# Patient Record
Sex: Male | Born: 1955 | Race: White | Hispanic: No | Marital: Single | Smoking: Current every day smoker
Health system: Southern US, Community
[De-identification: ages and names within clinical notes are randomized; demographics above are authoritative.]

## PROBLEM LIST (undated history)

## (undated) DIAGNOSIS — M1990 Unspecified osteoarthritis, unspecified site: Secondary | ICD-10-CM

## (undated) DIAGNOSIS — I499 Cardiac arrhythmia, unspecified: Secondary | ICD-10-CM

## (undated) DIAGNOSIS — K219 Gastro-esophageal reflux disease without esophagitis: Secondary | ICD-10-CM

## (undated) DIAGNOSIS — R0600 Dyspnea, unspecified: Secondary | ICD-10-CM

## (undated) DIAGNOSIS — I10 Essential (primary) hypertension: Secondary | ICD-10-CM

## (undated) DIAGNOSIS — E785 Hyperlipidemia, unspecified: Secondary | ICD-10-CM

## (undated) DIAGNOSIS — I2510 Atherosclerotic heart disease of native coronary artery without angina pectoris: Secondary | ICD-10-CM

## (undated) DIAGNOSIS — I219 Acute myocardial infarction, unspecified: Secondary | ICD-10-CM

## (undated) HISTORY — DX: Atherosclerotic heart disease of native coronary artery without angina pectoris: I25.10

## (undated) HISTORY — DX: Hyperlipidemia, unspecified: E78.5

## (undated) HISTORY — PX: TONSILLECTOMY: SUR1361

---

## 2016-04-16 ENCOUNTER — Other Ambulatory Visit (HOSPITAL_COMMUNITY): Payer: Self-pay | Admitting: Family

## 2016-04-16 ENCOUNTER — Ambulatory Visit (HOSPITAL_COMMUNITY)
Admission: RE | Admit: 2016-04-16 | Discharge: 2016-04-16 | Disposition: A | Discharge status: Home / Self Care | Payer: Self-pay | Source: Ambulatory Visit | Attending: Family | Admitting: Family

## 2016-04-16 DIAGNOSIS — M25551 Pain in right hip: Secondary | ICD-10-CM

## 2016-04-16 DIAGNOSIS — M1611 Unilateral primary osteoarthritis, right hip: Secondary | ICD-10-CM | POA: Insufficient documentation

## 2016-05-04 ENCOUNTER — Telehealth: Payer: Self-pay | Admitting: Orthopedic Surgery

## 2016-05-04 NOTE — Telephone Encounter (Signed)
Called patient for appointment for problem of hip pain, per email from financial counselor, Preston ConnorsAshley Ortiz, as she is working on completing his financial assistance paperwork. Left voice message for patient at his [phone]#[phone]

## 2016-05-06 NOTE — Telephone Encounter (Signed)
05/04/16 Spoke with patient; appointment scheduled; patient aware.

## 2016-05-21 ENCOUNTER — Ambulatory Visit: Payer: Self-pay | Admitting: Orthopedic Surgery

## 2016-05-28 ENCOUNTER — Ambulatory Visit: Payer: Self-pay | Admitting: Orthopedic Surgery

## 2016-09-02 ENCOUNTER — Telehealth: Payer: Self-pay | Admitting: Orthopedic Surgery

## 2016-09-02 NOTE — Telephone Encounter (Signed)
Patient's wife called this morning stating that she wanted to make an appointment for her husband. He was scheduled to see Dr. Romeo AppleHarrison in July but canceled that appointment and was scheduled again in August but was a no show for that appointment.  I told her that I do not have a letter from [facility] stating that he has the [facility] discount and that I would need this per our policy before I could schedule her husband.  She said she understood and she will bring this by the office.

## 2016-09-14 ENCOUNTER — Ambulatory Visit (INDEPENDENT_AMBULATORY_CARE_PROVIDER_SITE_OTHER): Payer: Self-pay

## 2016-09-14 ENCOUNTER — Ambulatory Visit (INDEPENDENT_AMBULATORY_CARE_PROVIDER_SITE_OTHER): Payer: Self-pay | Admitting: Orthopedic Surgery

## 2016-09-14 ENCOUNTER — Encounter: Payer: Self-pay | Admitting: Orthopedic Surgery

## 2016-09-14 VITALS — BP 155/102 | HR 90 | Ht 70.0 in | Wt 274.0 lb

## 2016-09-14 DIAGNOSIS — M1611 Unilateral primary osteoarthritis, right hip: Secondary | ICD-10-CM

## 2016-09-14 DIAGNOSIS — G8929 Other chronic pain: Secondary | ICD-10-CM

## 2016-09-14 DIAGNOSIS — M5441 Lumbago with sciatica, right side: Secondary | ICD-10-CM

## 2016-09-14 NOTE — Progress Notes (Signed)
Patient ID: Preston Ortiz Nickless, male   DOB: 01-Jan-1956, 60 y.o.   MRN: [id number]  Chief Complaint  Patient presents with  . Hip Pain    RIGHT HIP PAIN    HPI Preston Ortiz Passero is a 60 y.o. male.  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 Patient complains of pain for 15 years with no history of direct hip trauma. He has a history of lumbar spine pain since 1987 never had surgery.  He complains of pain over his right greater trochanter right buttock radiates to his right ankle and then he complains of pain in his right groin loss of motion in his right hip difficulty with activities of daily living  His prior treatment includes diclofenac ibuprofen and extra strength Tylenol  He is a smoker. He is allergic to penicillin.  [facility] [place]  Current medications are amlodipine and diclofenac he declined and stop taking gabapentin because he said it made him bleed  History of GERD hypertension arthritis  Family history cancer kidney disease and hypertension  History of tonsillectomy 1965  Review of Systems Review of Systems Denies shortness of breath  Denies chest pain    No past medical history on file.  No past surgical history on file.  Social History Social History  Substance Use Topics  . Smoking status: Not on file  . Smokeless tobacco: Not on file  . Alcohol use Not on file    Allergies not on file  No outpatient prescriptions have been marked as taking for the 09/14/16 encounter (Office Visit) with Vickki HearingStanley E Harrison, MD.      Physical Exam Physical Exam BP (!) 155/102   Pulse 90   Ht 5\' 10"  (1.778 m)   Wt 274 lb (124.3 kg)   BMI 39.31 kg/m   Gen. appearance. The patient is well-developed and well-nourished, grooming and hygiene are normal. There are no gross congenital abnormalities  The patient is alert and oriented to person place and time  Mood and affect are normal  Ambulation antalgic   Examination  reveals the following: On inspection we find tenderness in the right lower lumbar region nontender on the left no tenderness in the groin  With the range of motion of  right hip flexion 90 left hip flexion 110 both painful he has 0 internal rotation on the right and 10 on the left  Stability tests were normal  in each hip  Strength tests revealed grade 5 motor strength in each leg  Skin we find no rash ulceration or erythema right or left leg  Sensation remains intact right and left leg  Impression vascular system shows no peripheral edema right and left ankle  Data Reviewed There is an x-ray of the pelvis and hip which show severe arthritis and irregularity of the femoral head on the right osteoarthritis moderate on the left. The right hips looks severely diseased  Lumbar spine films taken in the office  Assessment    Encounter Diagnoses  Name Primary?  Marland Kitchen. Arthritis of right hip Yes  . Chronic right-sided low back pain with right-sided sciatica        Plan    Referral for right total hip arthroplasty  Encouraged to stop smoking       Fuller CanadaStanley Harrison 09/14/2016, 1:54 PM

## 2016-09-14 NOTE — Patient Instructions (Addendum)
Total Hip Replacement Total hip replacement is a surgery to replace your damaged hip joint. Your hip joint is replaced with a man-made (artificial) hip joint. This man-made hip joint is called a prosthesis. This surgery is done to lessen pain and improve movement. What happens before the procedure?  Do not eat or drink anything after midnight on the night before the procedure or as told by your doctor.  Ask your doctor about:  Changing or stopping your normal medicines. This is important if you take diabetes medicines or blood thinners.  Taking aspirin or ibuprofen medicines. These thin your blood. Do not take these medicines if your doctor tells you not to.  Plan to have someone take you home after the procedure.  Ask your health care team how your surgery site will be marked.  You may be given medicines that kill germs (antibiotics) to help prevent infection. What happens during the procedure?  To help prevent infection:  Your health care team will wash or sanitize their hands.  Your skin will be washed with soap.  An IV tube will be put into one of your veins.  You will be given one or more of the following:  A medicine that makes you relaxed (sedative).  A medicine that makes you fall asleep (general anesthetic).  A medicine that numbs your body below the waist (spinal anesthetic).  A cut (incision) will be made in your hip. Your surgeon will take out any damaged parts of your hip joint.  Your surgeon will then:  Put a man-made hip joint into your pelvic bone. Screws may be used to keep the hip joint in place.  Take out the damaged ball of your thigh bone (femur). A man-made ball on a metal pole will replace the damaged ball.  The ball will be put into the new socket to make a new hip joint. Your hip joint will be checked to see if it moves as it should.  Close the cut and place a bandage over it. What happens after the procedure?  You will stay in a recovery area  until your medicines wear off.  Your nurse will monitor your vital signs. These include:  Your pulse.  Your blood pressure.  Once you are doing okay, you will be taken to your hospital room.  You may be told to take actions to help prevent blood clots. These may include:  Walking soon after surgery with someone helping you. Moving around helps to improve blood flow.  Taking medicines to thin your blood (anticoagulants).  Wearing special socks (compression stockings) or using other types of devices.  You will do exercise therapy (physical therapy) until you are doing well. Your doctor will tell you when you are well enough to go home. This information is not intended to replace advice given to you by your health care provider. Make sure you discuss any questions you have with your health care provider. Document Released: 01/04/2012 Document Revised: 06/15/2016 Document Reviewed: 12/13/2013 Elsevier Interactive Patient Education  2017 [employer]

## 2016-10-14 ENCOUNTER — Encounter (INDEPENDENT_AMBULATORY_CARE_PROVIDER_SITE_OTHER): Payer: Self-pay | Admitting: Orthopaedic Surgery

## 2016-10-14 ENCOUNTER — Ambulatory Visit (INDEPENDENT_AMBULATORY_CARE_PROVIDER_SITE_OTHER): Payer: Self-pay | Admitting: Orthopaedic Surgery

## 2016-10-14 VITALS — Ht 70.0 in | Wt 274.0 lb

## 2016-10-14 DIAGNOSIS — M25551 Pain in right hip: Secondary | ICD-10-CM

## 2016-10-14 DIAGNOSIS — M1611 Unilateral primary osteoarthritis, right hip: Secondary | ICD-10-CM

## 2016-10-14 NOTE — Progress Notes (Signed)
Office Visit Note   Patient: Preston PopperDouglas Grassia           Date of Birth: 29-Jul-1956           MRN: [id number] Visit Date: 10/14/2016              Requested by: Jerrell BelfastKaren A House, FNP [address] [place], [place] [id number] PCP: Jerrell BelfastHouse, Karen A, FNP   Assessment & Plan: Visit Diagnoses:  1. Pain of right hip joint   2. Unilateral primary osteoarthritis, right hip     Plan: At this point his arthritis is very severe. It is detrimentally affected his activities daily living, his quality of life, and his mobility. He is tried and failed all forms conservative treatment and there is no way we can even try a steroid injection intra-articularly in that right hip because is no joint space at all. At this point we are recommending a total hip replacement through direct anterior approach. I showed him a hip model and his x-rays as explained in detail what the surgery involves as well as a thorough discussion of the risk and benefits of the surgery. He does wish proceed with this in the near future. We will work on getting this scheduled and then we would see him back 2 weeks postoperative but no x-rays of be needed.  Follow-Up Instructions: Return for 2 weeks post-op.   Orders:  No orders of the defined types were placed in this encounter.  No orders of the defined types were placed in this encounter.     Procedures: No procedures performed   Clinical Data: No additional findings.   Subjective: Chief Complaint  Patient presents with  . Right Hip - Pain    HPI He is a very pleasant 60 year old gentleman 15 years plus of right hip pain. He is follow-up for many ladders over the years. He says now his right leg is much shorter his left leg. His pain is 10 out of 10. It is daily pain. It wakes him up from sleep. His left hip feels normal to him. He said his right hip is very stiff and outs affected his back significantly. He has had x-rays that accompany him of his right hip and he has he has  severe osteoarthritis of the right hip. He works as a Lobbyisttraveling electrician. Review of Systems He currently denies any headache, shortness of breath, chest chest pain, fever, chills, nausea, vomiting.  Objective: Vital Signs: Ht 5\' 10"  (1.778 m)   Wt 274 lb (124.3 kg)   BMI 39.31 kg/m   Physical Exam He is a very pleasant individual. He is alert and oriented 3 in no acute distress. Ortho Exam He ambulates with a significant limp. His right leg is deathly shorter than his left leg. He has essentially almost no range of motion of that right hip due to severe stiffness and pain in the groin. Specialty Comments:  No specialty comments available.  Imaging: No results found. X-rays on the cone system of his pelvis and right hip show severe end-stage arthritis. There is complete loss of the hip joint. There is superior migration of the femoral head. There severe cystic changes and sclerotic changes. There is abundant para-articular osteophytes as well.  PMFS History: There are no active problems to display for this patient.  No past medical history on file.  No family history on file.  No past surgical history on file. Social History   Occupational History  . Not on file.  Social History Main Topics  . Smoking status: Current Every Day Smoker  . Smokeless tobacco: Not on file  . Alcohol use Not on file  . Drug use: Unknown  . Sexual activity: Not on file

## 2016-10-23 ENCOUNTER — Other Ambulatory Visit (INDEPENDENT_AMBULATORY_CARE_PROVIDER_SITE_OTHER): Payer: Self-pay | Admitting: Physician Assistant

## 2016-10-23 NOTE — Progress Notes (Signed)
Scheduling pre op-  Please PLACE SURGICAL ORDERS IN EPIC  THANKS 

## 2016-10-30 ENCOUNTER — Other Ambulatory Visit (HOSPITAL_COMMUNITY): Payer: Self-pay | Admitting: *Deleted

## 2016-10-30 NOTE — Patient Instructions (Addendum)
Preston Ortiz  10/30/2016   Your procedure is scheduled on: 11-06-16  [facility]  [facility] to 3rd floor to  Short Stay Center at 740 AM.  Call this number if you have problems the morning of surgery [phone]   Remember: ONLY 1 PERSON MAY GO WITH YOU TO SHORT STAY TO GET  READY MORNING OF YOUR SURGERY.  Do not eat food or drink liquids :After Midnight.     Take these medicines the morning of surgery with A SIP OF WATER: AMLODIPINE (NORVASC), TYLENOL as needed                               You may not have any metal on your body including hair pins and              piercings  Do not wear jewelry, make-up, lotions, powders or perfumes, deodorant             Do not wear nail polish.  Do not shave  48 hours prior to surgery.              Men may shave face and neck.   Do not bring valuables to the hospital. [facility] IS NOT             RESPONSIBLE   FOR VALUABLES.  Contacts, dentures or bridgework may not be worn into surgery.  Leave suitcase in the car. After surgery it may be brought to your room.                 Please read over the following fact sheets you were given: _____________________________________________________________________             [facility] - Preparing for Surgery Before surgery, you can play an important role.  Because skin is not sterile, your skin needs to be as free of germs as possible.  You can reduce the number of germs on your skin by washing with CHG (chlorahexidine gluconate) soap before surgery.  CHG is an antiseptic cleaner which kills germs and bonds with the skin to continue killing germs even after washing. Please DO NOT use if you have an allergy to CHG or antibacterial soaps.  If your skin becomes reddened/irritated stop using the CHG and inform your nurse when you arrive at Short Stay. Do not shave (including legs and underarms) for at least 48 hours prior to the first CHG  shower.  You may shave your face/neck. Please follow these instructions carefully:  1.  Shower with CHG Soap the night before surgery and the  morning of Surgery.  2.  If you choose to wash your hair, wash your hair first as usual with your  normal  shampoo.  3.  After you shampoo, rinse your hair and body thoroughly to remove the  shampoo.                           4.  Use CHG as you would any other liquid soap.  You can apply chg directly  to the skin and wash                       Gently with a scrungie or clean washcloth.  5.  Apply the  CHG Soap to your body ONLY FROM THE NECK DOWN.   Do not use on face/ open                           Wound or open sores. Avoid contact with eyes, ears mouth and genitals (private parts).                       Wash face,  Genitals (private parts) with your normal soap.             6.  Wash thoroughly, paying special attention to the area where your surgery  will be performed.  7.  Thoroughly rinse your body with warm water from the neck down.  8.  DO NOT shower/wash with your normal soap after using and rinsing off  the CHG Soap.                9.  Pat yourself dry with a clean towel.            10.  Wear clean pajamas.            11.  Place clean sheets on your bed the night of your first shower and do not  sleep with pets. Day of Surgery : Do not apply any lotions/deodorants the morning of surgery.  Please wear clean clothes to the hospital/surgery center.  FAILURE TO FOLLOW THESE INSTRUCTIONS MAY RESULT IN THE CANCELLATION OF YOUR SURGERY PATIENT SIGNATURE_________________________________  NURSE SIGNATURE__________________________________  ________________________________________________________________________   [name]  An incentive spirometer is a tool that can help keep your lungs clear and active. This tool measures how well you are filling your lungs with each breath. Taking long deep breaths may help reverse or decrease the chance  of developing breathing (pulmonary) problems (especially infection) following:  A long period of time when you are unable to move or be active. BEFORE THE PROCEDURE   If the spirometer includes an indicator to show your best effort, your nurse or respiratory therapist will set it to a desired goal.  If possible, sit up straight or lean slightly forward. Try not to slouch.  Hold the incentive spirometer in an upright position. INSTRUCTIONS FOR USE  1. Sit on the edge of your bed if possible, or sit up as far as you can in bed or on a chair. 2. Hold the incentive spirometer in an upright position. 3. Breathe out normally. 4. Place the mouthpiece in your mouth and seal your lips tightly around it. 5. Breathe in slowly and as deeply as possible, raising the piston or the ball toward the top of the column. 6. Hold your breath for 3-5 seconds or for as long as possible. Allow the piston or ball to fall to the bottom of the column. 7. Remove the mouthpiece from your mouth and breathe out normally. 8. Rest for a few seconds and repeat Steps 1 through 7 at least 10 times every 1-2 hours when you are awake. Take your time and take a few normal breaths between deep breaths. 9. The spirometer may include an indicator to show your best effort. Use the indicator as a goal to work toward during each repetition. 10. After each set of 10 deep breaths, practice coughing to be sure your lungs are clear. If you have an incision (the cut made at the time of surgery), support your incision when coughing by placing a pillow or rolled up  towels firmly against it. Once you are able to get out of bed, walk around indoors and cough well. You may stop using the incentive spirometer when instructed by your caregiver.  RISKS AND COMPLICATIONS  Take your time so you do not get dizzy or light-headed.  If you are in pain, you may need to take or ask for pain medication before doing incentive spirometry. It is harder to  take a deep breath if you are having pain. AFTER USE  Rest and breathe slowly and easily.  It can be helpful to keep track of a log of your progress. Your caregiver can provide you with a simple table to help with this. If you are using the spirometer at home, follow these instructions: [place] IF:   You are having difficultly using the spirometer.  You have trouble using the spirometer as often as instructed.  Your pain medication is not giving enough relief while using the spirometer.  You develop fever of 100.5 F (38.1 C) or higher. SEEK IMMEDIATE MEDICAL CARE IF:   You cough up bloody sputum that had not been present before.  You develop fever of 102 F (38.9 C) or greater.  You develop worsening pain at or near the incision site. MAKE SURE YOU:   Understand these instructions.  Will watch your condition.  Will get help right away if you are not doing well or get worse. Document Released: 02/22/2007 Document Revised: 01/04/2012 Document Reviewed: 04/25/2007 ExitCare Patient Information 2014 ExitCare, [place].   ________________________________________________________________________  WHAT IS A BLOOD TRANSFUSION? Blood Transfusion Information  A transfusion is the replacement of blood or some of its parts. Blood is made up of multiple cells which provide different functions.  Red blood cells carry oxygen and are used for blood loss replacement.  White blood cells fight against infection.  Platelets control bleeding.  Plasma helps clot blood.  Other blood products are available for specialized needs, such as hemophilia or other clotting disorders. BEFORE THE TRANSFUSION  Who gives blood for transfusions?   Healthy volunteers who are fully evaluated to make sure their blood is safe. This is blood bank blood. Transfusion therapy is the safest it has ever been in the practice of medicine. Before blood is taken from a donor, a complete history is taken to  make sure that person has no history of diseases nor engages in risky social behavior (examples are intravenous drug use or sexual activity with multiple partners). The donor's travel history is screened to minimize risk of transmitting infections, such as malaria. The donated blood is tested for signs of infectious diseases, such as HIV and hepatitis. The blood is then tested to be sure it is compatible with you in order to minimize the chance of a transfusion reaction. If you or a relative donates blood, this is often done in anticipation of surgery and is not appropriate for emergency situations. It takes many days to process the donated blood. RISKS AND COMPLICATIONS Although transfusion therapy is very safe and saves many lives, the main dangers of transfusion include:   Getting an infectious disease.  Developing a transfusion reaction. This is an allergic reaction to something in the blood you were given. Every precaution is taken to prevent this. The decision to have a blood transfusion has been considered carefully by your caregiver before blood is given. Blood is not given unless the benefits outweigh the risks. AFTER THE TRANSFUSION  Right after receiving a blood transfusion, you will usually feel much  better and more energetic. This is especially true if your red blood cells have gotten low (anemic). The transfusion raises the level of the red blood cells which carry oxygen, and this usually causes an energy increase.  The nurse administering the transfusion will monitor you carefully for complications. HOME CARE INSTRUCTIONS  No special instructions are needed after a transfusion. You may find your energy is better. Speak with your caregiver about any limitations on activity for underlying diseases you may have. SEEK MEDICAL CARE IF:   Your condition is not improving after your transfusion.  You develop redness or irritation at the intravenous (IV) site. SEEK IMMEDIATE MEDICAL CARE  IF:  Any of the following symptoms occur over the next 12 hours:  Shaking chills.  You have a temperature by mouth above 102 F (38.9 C), not controlled by medicine.  Chest, back, or muscle pain.  People around you feel you are not acting correctly or are confused.  Shortness of breath or difficulty breathing.  Dizziness and fainting.  You get a rash or develop hives.  You have a decrease in urine output.  Your urine turns a dark color or changes to pink, red, or brown. Any of the following symptoms occur over the next 10 days:  You have a temperature by mouth above 102 F (38.9 C), not controlled by medicine.  Shortness of breath.  Weakness after normal activity.  The white part of the eye turns yellow (jaundice).  You have a decrease in the amount of urine or are urinating less often.  Your urine turns a dark color or changes to pink, red, or brown. Document Released: 10/09/2000 Document Revised: 01/04/2012 Document Reviewed: 05/28/2008 [facility] Patient Information 2014 [place], [place].  _______________________________________________________________________

## 2016-11-02 ENCOUNTER — Encounter (HOSPITAL_COMMUNITY): Payer: Self-pay

## 2016-11-02 ENCOUNTER — Encounter (HOSPITAL_COMMUNITY)
Admission: RE | Admit: 2016-11-02 | Discharge: 2016-11-02 | Disposition: A | Discharge status: Home / Self Care | Payer: Self-pay | Source: Ambulatory Visit | Attending: Orthopaedic Surgery | Admitting: Orthopaedic Surgery

## 2016-11-02 DIAGNOSIS — I10 Essential (primary) hypertension: Secondary | ICD-10-CM | POA: Insufficient documentation

## 2016-11-02 DIAGNOSIS — Z01818 Encounter for other preprocedural examination: Secondary | ICD-10-CM | POA: Insufficient documentation

## 2016-11-02 DIAGNOSIS — M1611 Unilateral primary osteoarthritis, right hip: Secondary | ICD-10-CM | POA: Insufficient documentation

## 2016-11-02 HISTORY — DX: Dyspnea, unspecified: R06.00

## 2016-11-02 HISTORY — DX: Gastro-esophageal reflux disease without esophagitis: K21.9

## 2016-11-02 HISTORY — DX: Unspecified osteoarthritis, unspecified site: M19.90

## 2016-11-02 HISTORY — DX: Essential (primary) hypertension: I10

## 2016-11-02 LAB — BASIC METABOLIC PANEL
Anion gap: 7 (ref 5–15)
BUN: 12 mg/dL (ref 6–20)
CO2: 26 mmol/L (ref 22–32)
Calcium: 9.4 mg/dL (ref 8.9–10.3)
Chloride: 105 mmol/L (ref 101–111)
Creatinine, Ser: 0.81 mg/dL (ref 0.61–1.24)
GFR calc Af Amer: >60 mL/min (ref >60)
GFR calc non Af Amer: >60 mL/min (ref >60)
Glucose, Bld: 95 mg/dL (ref 65–99)
Potassium: 5 mmol/L (ref 3.5–5.1)
Sodium: 138 mmol/L (ref 135–145)

## 2016-11-02 LAB — CBC
HCT: 41.4 % (ref 39.0–52.0)
Hemoglobin: 14.3 g/dL (ref 13.0–17.0)
MCH: 31.9 pg (ref 26.0–34.0)
MCHC: 34.5 g/dL (ref 30.0–36.0)
MCV: 92.4 fL (ref 78.0–100.0)
Platelets: 307 10*3/uL (ref 150–400)
RBC: 4.48 MIL/uL (ref 4.22–5.81)
RDW: 12.9 % (ref 11.5–15.5)
WBC: 8.5 10*3/uL (ref 4.0–10.5)

## 2016-11-02 LAB — SURGICAL PCR SCREEN
MRSA, PCR: NEGATIVE
Staphylococcus aureus: NEGATIVE

## 2016-11-02 NOTE — Progress Notes (Signed)
Spoke with dr singer about ekg results from today's pre op, patient needs cardiac clearance per dr singer for 11-06-16 , called sherirrie billings  And made aware patient needs cardica clearance per dr singer.

## 2016-11-03 ENCOUNTER — Ambulatory Visit (INDEPENDENT_AMBULATORY_CARE_PROVIDER_SITE_OTHER): Payer: Self-pay | Admitting: Student

## 2016-11-03 ENCOUNTER — Encounter: Payer: Self-pay | Admitting: Student

## 2016-11-03 VITALS — BP 162/100 | HR 89 | Ht 70.0 in | Wt 283.0 lb

## 2016-11-03 DIAGNOSIS — Z720 Tobacco use: Secondary | ICD-10-CM

## 2016-11-03 DIAGNOSIS — Z01810 Encounter for preprocedural cardiovascular examination: Secondary | ICD-10-CM

## 2016-11-03 DIAGNOSIS — I10 Essential (primary) hypertension: Secondary | ICD-10-CM

## 2016-11-03 DIAGNOSIS — R002 Palpitations: Secondary | ICD-10-CM

## 2016-11-03 LAB — ABO/RH: ABO/RH(D): O NEG
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
## 2016-11-03 NOTE — Patient Instructions (Addendum)
Medication Instructions:  Your physician recommends that you continue on your current medications as directed. Please refer to the Current Medication list given to you today.  Labwork: None   Testing/Procedures: None   Follow-Up: Your physician recommends that you schedule a follow-up appointment in: AS NEEDED WITH DR SwazilandJORDAN.  Any Other Special Instructions Will Be Listed Below (If Applicable).  If you need a refill on your cardiac medications before your next appointment, please call your pharmacy.

## 2016-11-04 ENCOUNTER — Telehealth (INDEPENDENT_AMBULATORY_CARE_PROVIDER_SITE_OTHER): Payer: Self-pay | Admitting: Orthopaedic Surgery

## 2016-11-04 NOTE — Telephone Encounter (Signed)
Patient request a paper copy of his hip xray.

## 2016-11-04 NOTE — Telephone Encounter (Signed)
Mailed to patient home per patient

## 2016-11-04 NOTE — Progress Notes (Signed)
Cardiac clearance note brittany strader pa 11-03-16 epic for 11-06-16 surgery

## 2016-11-06 ENCOUNTER — Inpatient Hospital Stay (HOSPITAL_COMMUNITY): Payer: Self-pay

## 2016-11-06 ENCOUNTER — Inpatient Hospital Stay (HOSPITAL_COMMUNITY): Payer: Self-pay | Admitting: Anesthesiology

## 2016-11-06 ENCOUNTER — Inpatient Hospital Stay (HOSPITAL_COMMUNITY)
Admission: RE | Admit: 2016-11-06 | Discharge: 2016-11-07 | DRG: 470 | Disposition: A | Discharge status: Home / Self Care | Payer: Self-pay | Source: Ambulatory Visit | Attending: Orthopaedic Surgery | Admitting: Orthopaedic Surgery

## 2016-11-06 ENCOUNTER — Encounter (HOSPITAL_COMMUNITY)
Admission: RE | Disposition: A | Discharge status: Home / Self Care | Payer: Self-pay | Source: Ambulatory Visit | Attending: Orthopaedic Surgery

## 2016-11-06 ENCOUNTER — Encounter (HOSPITAL_COMMUNITY): Payer: Self-pay | Admitting: *Deleted

## 2016-11-06 DIAGNOSIS — F17210 Nicotine dependence, cigarettes, uncomplicated: Secondary | ICD-10-CM | POA: Diagnosis present

## 2016-11-06 DIAGNOSIS — Z880 Allergy status to penicillin: Secondary | ICD-10-CM

## 2016-11-06 DIAGNOSIS — M1611 Unilateral primary osteoarthritis, right hip: Principal | ICD-10-CM

## 2016-11-06 DIAGNOSIS — Z888 Allergy status to other drugs, medicaments and biological substances status: Secondary | ICD-10-CM

## 2016-11-06 DIAGNOSIS — Z96641 Presence of right artificial hip joint: Secondary | ICD-10-CM

## 2016-11-06 DIAGNOSIS — K219 Gastro-esophageal reflux disease without esophagitis: Secondary | ICD-10-CM | POA: Diagnosis present

## 2016-11-06 DIAGNOSIS — I10 Essential (primary) hypertension: Secondary | ICD-10-CM | POA: Diagnosis present

## 2016-11-06 DIAGNOSIS — Z419 Encounter for procedure for purposes other than remedying health state, unspecified: Secondary | ICD-10-CM

## 2016-11-06 HISTORY — PX: TOTAL HIP ARTHROPLASTY: SHX124

## 2016-11-06 LAB — TYPE AND SCREEN
ABO/RH(D): O NEG
Antibody Screen: NEGATIVE

## 2016-11-06 SURGERY — ARTHROPLASTY, HIP, TOTAL, ANTERIOR APPROACH
Anesthesia: Spinal | Site: Hip | Laterality: Right

## 2016-11-06 MED ORDER — ALUM & MAG HYDROXIDE-SIMETH 200-200-20 MG/5ML PO SUSP
30.0000 mL | ORAL | Status: DC | PRN
Start: 2016-11-06 — End: 2016-11-07

## 2016-11-06 MED ORDER — AMLODIPINE BESYLATE 10 MG PO TABS
10.0000 mg | ORAL_TABLET | Freq: Every day | ORAL | Status: DC
  Administered 2016-11-07: 10 mg via ORAL
  Filled 2016-11-06: qty 1

## 2016-11-06 MED ORDER — DOCUSATE SODIUM 100 MG PO CAPS
100.0000 mg | ORAL_CAPSULE | Freq: Two times a day (BID) | ORAL | Status: DC
  Administered 2016-11-06 – 2016-11-07 (×2): 100 mg via ORAL
  Filled 2016-11-06 (×2): qty 1

## 2016-11-06 MED ORDER — ONDANSETRON HCL 4 MG PO TABS: 4.0000 mg | ORAL_TABLET | Freq: Four times a day (QID) | ORAL | Status: DC | PRN

## 2016-11-06 MED ORDER — SODIUM CHLORIDE 0.9 % IV SOLN
INTRAVENOUS | Status: DC
  Administered 2016-11-06: 12:00:00 75 mL/h via INTRAVENOUS
  Administered 2016-11-07: 02:00:00 via INTRAVENOUS

## 2016-11-06 MED ORDER — STERILE WATER FOR IRRIGATION IR SOLN
Status: DC | PRN
  Administered 2016-11-06: 3000 mL

## 2016-11-06 MED ORDER — BUPIVACAINE IN DEXTROSE 0.75-8.25 % IT SOLN
INTRATHECAL | Status: DC | PRN
  Administered 2016-11-06: 2 mL via INTRATHECAL

## 2016-11-06 MED ORDER — METHOCARBAMOL 500 MG PO TABS: 500.0000 mg | ORAL_TABLET | Freq: Four times a day (QID) | ORAL | Status: DC | PRN

## 2016-11-06 MED ORDER — HYDROMORPHONE HCL 1 MG/ML IJ SOLN: 0.2500 mg | INTRAMUSCULAR | Status: DC | PRN

## 2016-11-06 MED ORDER — LACTATED RINGERS IV SOLN
INTRAVENOUS | Status: DC
  Administered 2016-11-06: 1000 mL via INTRAVENOUS

## 2016-11-06 MED ORDER — CLINDAMYCIN PHOSPHATE 600 MG/50ML IV SOLN
600.0000 mg | Freq: Four times a day (QID) | INTRAVENOUS | Status: AC
  Administered 2016-11-06 (×2): 600 mg via INTRAVENOUS
  Filled 2016-11-06 (×2): qty 50

## 2016-11-06 MED ORDER — ONDANSETRON HCL 4 MG/2ML IJ SOLN
INTRAMUSCULAR | Status: AC
Start: 2016-11-06 — End: 2016-11-06
  Filled 2016-11-06: qty 2

## 2016-11-06 MED ORDER — CHLORHEXIDINE GLUCONATE 4 % EX LIQD: 60.0000 mL | Freq: Once | CUTANEOUS | Status: DC

## 2016-11-06 MED ORDER — PROPOFOL 500 MG/50ML IV EMUL
INTRAVENOUS | Status: DC | PRN
  Administered 2016-11-06: 75 ug/kg/min via INTRAVENOUS

## 2016-11-06 MED ORDER — CLINDAMYCIN PHOSPHATE 900 MG/50ML IV SOLN
INTRAVENOUS | Status: AC
  Filled 2016-11-06: qty 50

## 2016-11-06 MED ORDER — SODIUM CHLORIDE 0.9 % IR SOLN
Status: DC | PRN
  Administered 2016-11-06: 1000 mL

## 2016-11-06 MED ORDER — MIDAZOLAM HCL 5 MG/5ML IJ SOLN
INTRAMUSCULAR | Status: DC | PRN
  Administered 2016-11-06: 2 mg via INTRAVENOUS

## 2016-11-06 MED ORDER — PHENYLEPHRINE HCL 10 MG/ML IJ SOLN
INTRAMUSCULAR | Status: AC
  Filled 2016-11-06: qty 1

## 2016-11-06 MED ORDER — PROPOFOL 10 MG/ML IV BOLUS
INTRAVENOUS | Status: DC | PRN
  Administered 2016-11-06: 20 mg via INTRAVENOUS
  Administered 2016-11-06: 10 mg via INTRAVENOUS
  Administered 2016-11-06: 30 mg via INTRAVENOUS
  Administered 2016-11-06 (×2): 20 mg via INTRAVENOUS

## 2016-11-06 MED ORDER — OXYCODONE HCL 5 MG PO TABS
5.0000 mg | ORAL_TABLET | ORAL | Status: DC | PRN
  Administered 2016-11-06: 5 mg via ORAL
  Administered 2016-11-06: 10 mg via ORAL
  Administered 2016-11-06: 5 mg via ORAL
  Administered 2016-11-06 – 2016-11-07 (×2): 10 mg via ORAL
  Filled 2016-11-06 (×2): qty 2
  Filled 2016-11-06 (×4): qty 1

## 2016-11-06 MED ORDER — CLINDAMYCIN PHOSPHATE 900 MG/50ML IV SOLN
900.0000 mg | INTRAVENOUS | Status: AC
  Administered 2016-11-06: 900 mg via INTRAVENOUS

## 2016-11-06 MED ORDER — METHOCARBAMOL 1000 MG/10ML IJ SOLN
500.0000 mg | Freq: Four times a day (QID) | INTRAMUSCULAR | Status: DC | PRN
  Administered 2016-11-06: 500 mg via INTRAVENOUS
  Filled 2016-11-06: qty 5
  Filled 2016-11-06: qty 550

## 2016-11-06 MED ORDER — METOCLOPRAMIDE HCL 5 MG/ML IJ SOLN: 5.0000 mg | Freq: Three times a day (TID) | INTRAMUSCULAR | Status: DC | PRN

## 2016-11-06 MED ORDER — MENTHOL 3 MG MT LOZG: 1.0000 | LOZENGE | OROMUCOSAL | Status: DC | PRN

## 2016-11-06 MED ORDER — TRANEXAMIC ACID 1000 MG/10ML IV SOLN
1000.0000 mg | INTRAVENOUS | Status: AC
  Administered 2016-11-06: 1000 mg via INTRAVENOUS
  Filled 2016-11-06: qty 1100

## 2016-11-06 MED ORDER — ONDANSETRON HCL 4 MG/2ML IJ SOLN: 4.0000 mg | Freq: Four times a day (QID) | INTRAMUSCULAR | Status: DC | PRN

## 2016-11-06 MED ORDER — KETOROLAC TROMETHAMINE 15 MG/ML IJ SOLN
7.5000 mg | Freq: Four times a day (QID) | INTRAMUSCULAR | Status: AC
  Administered 2016-11-06 – 2016-11-07 (×4): 7.5 mg via INTRAVENOUS
  Filled 2016-11-06 (×4): qty 1

## 2016-11-06 MED ORDER — DEXAMETHASONE SODIUM PHOSPHATE 10 MG/ML IJ SOLN
INTRAMUSCULAR | Status: DC | PRN
  Administered 2016-11-06: 10 mg via INTRAVENOUS

## 2016-11-06 MED ORDER — DIPHENHYDRAMINE HCL 12.5 MG/5ML PO ELIX: 12.5000 mg | ORAL_SOLUTION | ORAL | Status: DC | PRN

## 2016-11-06 MED ORDER — ONDANSETRON HCL 4 MG/2ML IJ SOLN
INTRAMUSCULAR | Status: DC | PRN
  Administered 2016-11-06: 4 mg via INTRAVENOUS

## 2016-11-06 MED ORDER — PHENOL 1.4 % MT LIQD: 1.0000 | OROMUCOSAL | Status: DC | PRN

## 2016-11-06 MED ORDER — PHENYLEPHRINE HCL 10 MG/ML IJ SOLN
INTRAMUSCULAR | Status: DC | PRN
  Administered 2016-11-06: 40 ug via INTRAVENOUS

## 2016-11-06 MED ORDER — 0.9 % SODIUM CHLORIDE (POUR BTL) OPTIME
TOPICAL | Status: DC | PRN
  Administered 2016-11-06: 1000 mL

## 2016-11-06 MED ORDER — PHENYLEPHRINE HCL 10 MG/ML IJ SOLN
INTRAMUSCULAR | Status: DC | PRN
  Administered 2016-11-06: 40 ug/min via INTRAVENOUS

## 2016-11-06 MED ORDER — BISACODYL 10 MG RE SUPP: 10.0000 mg | Freq: Every day | RECTAL | Status: DC | PRN

## 2016-11-06 MED ORDER — DEXAMETHASONE SODIUM PHOSPHATE 10 MG/ML IJ SOLN
INTRAMUSCULAR | Status: AC
  Filled 2016-11-06: qty 1

## 2016-11-06 MED ORDER — PROPOFOL 10 MG/ML IV BOLUS
INTRAVENOUS | Status: AC
  Filled 2016-11-06: qty 20

## 2016-11-06 MED ORDER — PROPOFOL 10 MG/ML IV BOLUS
INTRAVENOUS | Status: AC
  Filled 2016-11-06: qty 60

## 2016-11-06 MED ORDER — ZOLPIDEM TARTRATE 5 MG PO TABS: 5.0000 mg | ORAL_TABLET | Freq: Every evening | ORAL | Status: DC | PRN

## 2016-11-06 MED ORDER — ASPIRIN 81 MG PO CHEW
81.0000 mg | CHEWABLE_TABLET | Freq: Two times a day (BID) | ORAL | Status: DC
  Administered 2016-11-06 – 2016-11-07 (×2): 81 mg via ORAL
  Filled 2016-11-06 (×2): qty 1

## 2016-11-06 MED ORDER — FENTANYL CITRATE (PF) 100 MCG/2ML IJ SOLN
INTRAMUSCULAR | Status: AC
  Filled 2016-11-06: qty 2

## 2016-11-06 MED ORDER — HYDROMORPHONE HCL 1 MG/ML IJ SOLN: 1.0000 mg | INTRAMUSCULAR | Status: DC | PRN

## 2016-11-06 MED ORDER — METOCLOPRAMIDE HCL 5 MG PO TABS
5.0000 mg | ORAL_TABLET | Freq: Three times a day (TID) | ORAL | Status: DC | PRN
Start: 2016-11-06 — End: 2016-11-07

## 2016-11-06 MED ORDER — ACETAMINOPHEN 325 MG PO TABS: 650.0000 mg | ORAL_TABLET | Freq: Four times a day (QID) | ORAL | Status: DC | PRN

## 2016-11-06 MED ORDER — FENTANYL CITRATE (PF) 100 MCG/2ML IJ SOLN
INTRAMUSCULAR | Status: DC | PRN
  Administered 2016-11-06: 50 ug via INTRAVENOUS

## 2016-11-06 MED ORDER — MIDAZOLAM HCL 2 MG/2ML IJ SOLN
INTRAMUSCULAR | Status: AC
  Filled 2016-11-06: qty 2

## 2016-11-06 MED ORDER — ACETAMINOPHEN 650 MG RE SUPP: 650.0000 mg | Freq: Four times a day (QID) | RECTAL | Status: DC | PRN

## 2016-11-06 MED ORDER — PHENYLEPHRINE 40 MCG/ML (10ML) SYRINGE FOR IV PUSH (FOR BLOOD PRESSURE SUPPORT)
PREFILLED_SYRINGE | INTRAVENOUS | Status: AC
  Filled 2016-11-06: qty 10

## 2016-11-06 SURGICAL SUPPLY — 34 items
BAG ZIPLOCK 12X15 (MISCELLANEOUS) IMPLANT
BENZOIN TINCTURE PRP APPL 2/3 (GAUZE/BANDAGES/DRESSINGS) IMPLANT
BLADE SAW SGTL 18X1.27X75 (BLADE) ×2 IMPLANT
CAPT HIP TOTAL 2 ×2 IMPLANT
CELLS DAT CNTRL 66122 CELL SVR (MISCELLANEOUS) ×1 IMPLANT
CLOTH BEACON ORANGE TIMEOUT ST (SAFETY) ×2 IMPLANT
COVER PERINEAL POST (MISCELLANEOUS) ×2 IMPLANT
DRAPE STERI IOBAN 125X83 (DRAPES) ×2 IMPLANT
DRAPE U-SHAPE 47X51 STRL (DRAPES) ×4 IMPLANT
DRSG AQUACEL AG ADV 3.5X10 (GAUZE/BANDAGES/DRESSINGS) ×2 IMPLANT
DURAPREP 26ML APPLICATOR (WOUND CARE) ×2 IMPLANT
ELECT REM PT RETURN 9FT ADLT (ELECTROSURGICAL) ×2
ELECTRODE REM PT RTRN 9FT ADLT (ELECTROSURGICAL) ×1 IMPLANT
GAUZE XEROFORM 1X8 LF (GAUZE/BANDAGES/DRESSINGS) ×2 IMPLANT
GLOVE BIO SURGEON STRL SZ7.5 (GLOVE) ×2 IMPLANT
GLOVE BIOGEL PI IND STRL 8 (GLOVE) ×6 IMPLANT
GLOVE BIOGEL PI INDICATOR 8 (GLOVE) ×6
GLOVE ECLIPSE 8.0 STRL XLNG CF (GLOVE) ×2 IMPLANT
GOWN STRL REUS W/TWL XL LVL3 (GOWN DISPOSABLE) ×8 IMPLANT
HANDPIECE INTERPULSE COAX TIP (DISPOSABLE) ×1
HOLDER FOLEY CATH W/STRAP (MISCELLANEOUS) ×2 IMPLANT
PACK ANTERIOR HIP CUSTOM (KITS) ×2 IMPLANT
RTRCTR WOUND ALEXIS 18CM MED (MISCELLANEOUS) ×2
SET HNDPC FAN SPRY TIP SCT (DISPOSABLE) ×1 IMPLANT
STAPLER VISISTAT 35W (STAPLE) ×2 IMPLANT
STRIP CLOSURE SKIN 1/2X4 (GAUZE/BANDAGES/DRESSINGS) IMPLANT
SUT ETHIBOND NAB CT1 #1 30IN (SUTURE) ×2 IMPLANT
SUT MNCRL AB 4-0 PS2 18 (SUTURE) IMPLANT
SUT VIC AB 0 CT1 36 (SUTURE) ×2 IMPLANT
SUT VIC AB 1 CT1 36 (SUTURE) ×2 IMPLANT
SUT VIC AB 2-0 CT1 27 (SUTURE) ×2
SUT VIC AB 2-0 CT1 TAPERPNT 27 (SUTURE) ×2 IMPLANT
TRAY FOLEY W/METER SILVER 16FR (SET/KITS/TRAYS/PACK) ×2 IMPLANT
YANKAUER SUCT BULB TIP 10FT TU (MISCELLANEOUS) ×2 IMPLANT

## 2016-11-06 NOTE — Anesthesia Procedure Notes (Signed)
Spinal  Patient location during procedure: OR Start time: 11/06/2016 9:24 AM End time: 11/06/2016 9:29 AM Reason for block: at surgeon's request Staffing Resident/CRNA: Anne Fu Performed: resident/CRNA  Preanesthetic Checklist Completed: patient identified, site marked, surgical consent, pre-op evaluation, timeout performed, IV checked, risks and benefits discussed and monitors and equipment checked Spinal Block Patient position: sitting Prep: DuraPrep Patient monitoring: heart rate, continuous pulse ox and blood pressure Approach: right paramedian Location: L2-3 Injection technique: single-shot Needle Needle type: Pencan  Needle gauge: 24 G Needle length: 9 cm Assessment Sensory level: T6 Additional Notes Expiration date of kit checked and confirmed. Patient tolerated procedure well, without complications. X 1 attempt with noted clear CSF return. Loss of motor and sensory on exam post injection.

## 2016-11-06 NOTE — Anesthesia Postprocedure Evaluation (Signed)
Anesthesia Post Note  Patient: Preston Ortiz  Procedure(s) Performed: Procedure(s) (LRB): RIGHT TOTAL HIP ARTHROPLASTY ANTERIOR APPROACH (Right)  Patient location during evaluation: PACU Anesthesia Type: Spinal Level of consciousness: awake Pain management: pain level controlled Vital Signs Assessment: post-procedure vital signs reviewed and stable Respiratory status: spontaneous breathing Cardiovascular status: stable Anesthetic complications: no       Last Vitals:  Vitals:   11/06/16 1400 11/06/16 1500  BP: (!) 165/89 (!) 154/86  Pulse: 82 82  Resp: 15 16  Temp: 36.8 C 36.7 C    Last Pain:  Vitals:   11/06/16 1530  TempSrc:   PainSc: 4                  Doria Fern

## 2016-11-06 NOTE — Evaluation (Signed)
Physical Therapy Evaluation Patient Details Name: Preston Ortiz MRN: [id number] DOB: [birth year] Today's Date: 11/06/2016   History of Present Illness  61 yo male s/p R THA-direct anterior 11/06/16  Clinical Impression  On eval POD 0, pt required Min assist for mobility. He walked ~45 with a RW. Pain rated 5/10 with activity. Will follow and progress activity as tolerated.     Follow Up Recommendations No PT follow up;Supervision for mobility/OOB    Equipment Recommendations  Rolling walker with 5" wheels    Recommendations for Other Services OT consult     Precautions / Restrictions Precautions Precautions: Fall Restrictions Weight Bearing Restrictions: No RLE Weight Bearing: Weight bearing as tolerated      Mobility  Bed Mobility Overal bed mobility: Needs Assistance Bed Mobility: Supine to Sit     Supine to sit: Min assist;HOB elevated     General bed mobility comments: Assist for R LE. Increased time. Pt used bedrail  Transfers Overall transfer level: Needs assistance Equipment used: Rolling walker (2 wheeled) Transfers: Sit to/from Stand Sit to Stand: Min assist         General transfer comment: VCs safety, technique, hand/LE placement.   Ambulation/Gait Ambulation/Gait assistance: Min guard Ambulation Distance (Feet): 45 Feet Assistive device: Rolling walker (2 wheeled) Gait Pattern/deviations: Step-to pattern;Step-through pattern;Decreased stride length     General Gait Details: close guard for safety. VCs seqeunce.   Stairs            Wheelchair Mobility    Modified Rankin (Stroke Patients Only)       Balance                                             Pertinent Vitals/Pain Pain Assessment: 0-10 Pain Score: 5  Pain Location: R upper thigh Pain Descriptors / Indicators: Sore;Tightness Pain Intervention(s): Monitored during session;Repositioned;Ice applied    Home Living Family/patient expects to be  discharged to:: Private residence Living Arrangements: Spouse/significant other Available Help at Discharge: Family Type of Home: Mobile home Home Access: Stairs to enter Entrance Stairs-Rails: Right Entrance Stairs-Number of Steps: 4 Home Layout: One level Home Equipment: None      Prior Function Level of Independence: Independent               Hand Dominance        Extremity/Trunk Assessment   Upper Extremity Assessment Upper Extremity Assessment: Defer to OT evaluation    Lower Extremity Assessment Lower Extremity Assessment: Generalized weakness (s/p r THA)    Cervical / Trunk Assessment Cervical / Trunk Assessment: Normal  Communication   Communication: No difficulties  Cognition Arousal/Alertness: Awake/alert Behavior During Therapy: WFL for tasks assessed/performed Overall Cognitive Status: Within Functional Limits for tasks assessed                      General Comments      Exercises     Assessment/Plan    PT Assessment Patient needs continued PT services  PT Problem List Decreased strength;Decreased mobility;Decreased range of motion;Decreased balance;Decreased activity tolerance;Decreased knowledge of use of DME;Pain          PT Treatment Interventions DME instruction;Therapeutic activities;Gait training;Therapeutic exercise;Patient/family education;Functional mobility training;Stair training;Balance training    PT Goals (Current goals can be found in the Care Plan section)  Acute Rehab PT Goals Patient Stated Goal: regain Ind PT Goal  Formulation: With patient Time For Goal Achievement: 11/20/16 Potential to Achieve Goals: Good    Frequency 7X/week   Barriers to discharge        Co-evaluation               End of Session   Activity Tolerance: Patient tolerated treatment well Patient left: in chair;with call bell/phone within reach;with family/visitor present           Time: 1543-1600 PT Time Calculation  (min) (ACUTE ONLY): 17 min   Charges:   PT Evaluation $PT Eval Low Complexity: 1 Procedure     PT G Codes:        Rebeca AlertJannie Yarel Kilcrease, MPT Pager: [phone]

## 2016-11-06 NOTE — Brief Op Note (Signed)
11/06/2016  10:38 AM  PATIENT:  Preston Ortiz  61 y.o. male  PRE-OPERATIVE DIAGNOSIS:  right hip osteoarthritis  POST-OPERATIVE DIAGNOSIS:  right hip osteoarthritis  PROCEDURE:  Procedure(s): RIGHT TOTAL HIP ARTHROPLASTY ANTERIOR APPROACH (Right)  SURGEON:  Surgeon(s) and Role:    * Kathryne Hitchhristopher Y Jatoria Kneeland, MD - Primary  PHYSICIAN ASSISTANT: Rexene EdisonGil Clark, PA-C  ANESTHESIA:   spinal  EBL:  Total I/O In: -  Out: 200 [Urine:100; Blood:100]  COUNTS:  YES  DICTATION: .Other Dictation: Dictation Number [id number]  PLAN OF CARE: Admit to inpatient   PATIENT DISPOSITION:  PACU - hemodynamically stable.   Delay start of Pharmacological VTE agent (>24hrs) due to surgical blood loss or risk of bleeding: no

## 2016-11-06 NOTE — Anesthesia Preprocedure Evaluation (Signed)
Anesthesia Evaluation  Patient identified by MRN, date of birth, ID band Patient awake    Reviewed: Allergy & Precautions, NPO status , Patient's Chart, lab work & pertinent test results  Airway Mallampati: II  TM Distance: >3 FB     Dental   Pulmonary shortness of breath, Current Smoker,    breath sounds clear to auscultation       Cardiovascular hypertension,  Rhythm:Regular Rate:Normal     Neuro/Psych    GI/Hepatic Neg liver ROS, GERD  ,  Endo/Other  negative endocrine ROS  Renal/GU negative Renal ROS     Musculoskeletal  (+) Arthritis ,   Abdominal   Peds  Hematology   Anesthesia Other Findings   Reproductive/Obstetrics                             Anesthesia Physical Anesthesia Plan  ASA: III  Anesthesia Plan: Spinal   Post-op Pain Management:    Induction: Intravenous  Airway Management Planned:   Additional Equipment:   Intra-op Plan:   Post-operative Plan:   Informed Consent: I have reviewed the patients History and Physical, chart, labs and discussed the procedure including the risks, benefits and alternatives for the proposed anesthesia with the patient or authorized representative who has indicated his/her understanding and acceptance.   Dental advisory given  Plan Discussed with: CRNA and Anesthesiologist  Anesthesia Plan Comments:         Anesthesia Quick Evaluation

## 2016-11-06 NOTE — Transfer of Care (Signed)
Immediate Anesthesia Transfer of Care Note  Patient: Preston Ortiz  Procedure(s) Performed: Procedure(s): RIGHT TOTAL HIP ARTHROPLASTY ANTERIOR APPROACH (Right)  Patient Location: PACU  Anesthesia Type:Spinal  Level of Consciousness:  sedated, patient cooperative and responds to stimulation  Airway & Oxygen Therapy:Patient Spontanous Breathing and Patient connected to face mask oxgen  Post-op Assessment:  Report given to PACU RN and Post -op Vital signs reviewed and stable  Post vital signs:  Reviewed and stable  Last Vitals:  Vitals:   11/06/16 0740  BP: (!) 146/85  Pulse: 99  Resp: 20  Temp: 36.7 C    Complications: No apparent anesthesia complications

## 2016-11-06 NOTE — H&P (Signed)
TOTAL HIP ADMISSION H&P  Patient is admitted for right total hip arthroplasty.  Subjective:  Chief Complaint: right hip pain  HPI: Preston Ortiz, 61 y.o. male, has a history of pain and functional disability in the right hip(s) due to arthritis and patient has failed non-surgical conservative treatments for greater than 12 weeks to include NSAID's and/or analgesics, corticosteriod injections, weight reduction as appropriate and activity modification.  Onset of symptoms was gradual starting 5 years ago with gradually worsening course since that time.The patient noted no past surgery on the right hip(s).  Patient currently rates pain in the right hip at 10 out of 10 with activity. Patient has night pain, worsening of pain with activity and weight bearing, trendelenberg gait, pain that interfers with activities of daily living, pain with passive range of motion and crepitus. Patient has evidence of subchondral cysts, subchondral sclerosis, periarticular osteophytes and joint space narrowing by imaging studies. This condition presents safety issues increasing the risk of falls.  There is no current active infection.  Patient Active Problem List   Diagnosis Date Noted  . Unilateral primary osteoarthritis, right hip 11/06/2016   Past Medical History:  Diagnosis Date  . Arthritis    osteoathritis  . Dyspnea    pain related  . GERD (gastroesophageal reflux disease)    ocassional  . Hypertension    pain related per patient    Past Surgical History:  Procedure Laterality Date  . TONSILLECTOMY     chilhood    No prescriptions prior to admission.   Allergies  Allergen Reactions  . Cyclobenzaprine     Blurred vision   . Gabapentin     Bloody stools   . Penicillins     childhood allergy  Has patient had a PCN reaction causing immediate rash, facial/tongue/throat swelling, SOB or lightheadedness with hypotension: Unknown Has patient had a PCN reaction causing severe rash involving  mucus membranes or skin necrosis: Unknown Has patient had a PCN reaction that required hospitalization Unknown Has patient had a PCN reaction occurring within the last 10 years: No If all of the above answers are "NO", then may proceed with Cephalosporin use.     Social History  Substance Use Topics  . Smoking status: Current Every Day Smoker    Packs/day: 1.00    Years: 40.00    Types: Cigarettes  . Smokeless tobacco: Never Used  . Alcohol use Yes     Comment: social drinker    Family History  Problem Relation Age of Onset  . Cancer Mother   . CAD Neg Hx      Review of Systems  Musculoskeletal: Positive for back pain and joint pain.  All other systems reviewed and are negative.   Objective:  Physical Exam  Constitutional: He is oriented to person, place, and time. He appears well-developed and well-nourished.  HENT:  Head: Normocephalic and atraumatic.  Eyes: EOM are normal. Pupils are equal, round, and reactive to light.  Neck: Normal range of motion. Neck supple.  Cardiovascular: Normal rate and regular rhythm.   Respiratory: Effort normal and breath sounds normal.  GI: Soft. Bowel sounds are normal.  Musculoskeletal:       Right hip: He exhibits decreased range of motion, decreased strength, tenderness, bony tenderness and crepitus.  Neurological: He is alert and oriented to person, place, and time.  Skin: Skin is warm and dry.  Psychiatric: He has a normal mood and affect.    Vital signs in last 24 hours:  Labs:   Estimated body mass index is 40.61 kg/m as calculated from the following:   Height as of 11/03/16: 5\' 10"  (1.778 m).   Weight as of 11/03/16: 283 lb (128.4 kg).   Imaging Review Plain radiographs demonstrate severe degenerative joint disease of the right hip(s). The bone quality appears to be good for age and reported activity level.  Assessment/Plan:  End stage arthritis, right hip(s)  The patient history, physical examination, clinical  judgement of the provider and imaging studies are consistent with end stage degenerative joint disease of the right hip(s) and total hip arthroplasty is deemed medically necessary. The treatment options including medical management, injection therapy, arthroscopy and arthroplasty were discussed at length. The risks and benefits of total hip arthroplasty were presented and reviewed. The risks due to aseptic loosening, infection, stiffness, dislocation/subluxation,  thromboembolic complications and other imponderables were discussed.  The patient acknowledged the explanation, agreed to proceed with the plan and consent was signed. Patient is being admitted for inpatient treatment for surgery, pain control, PT, OT, prophylactic antibiotics, VTE prophylaxis, progressive ambulation and ADL's and discharge planning.The patient is planning to be discharged home with home health services

## 2016-11-07 LAB — BASIC METABOLIC PANEL
Anion gap: 9 (ref 5–15)
BUN: 19 mg/dL (ref 6–20)
CO2: 27 mmol/L (ref 22–32)
Calcium: 9.4 mg/dL (ref 8.9–10.3)
Chloride: 101 mmol/L (ref 101–111)
Creatinine, Ser: 0.94 mg/dL (ref 0.61–1.24)
GFR calc Af Amer: >60 mL/min (ref >60)
GFR calc non Af Amer: >60 mL/min (ref >60)
Glucose, Bld: 164 mg/dL — ABNORMAL HIGH (ref 65–99)
Potassium: 4.9 mmol/L (ref 3.5–5.1)
Sodium: 137 mmol/L (ref 135–145)

## 2016-11-07 LAB — CBC
HCT: 34.6 % — ABNORMAL LOW (ref 39.0–52.0)
Hemoglobin: 12.1 g/dL — ABNORMAL LOW (ref 13.0–17.0)
MCH: 32.1 pg (ref 26.0–34.0)
MCHC: 35 g/dL (ref 30.0–36.0)
MCV: 91.8 fL (ref 78.0–100.0)
Platelets: 286 10*3/uL (ref 150–400)
RBC: 3.77 MIL/uL — ABNORMAL LOW (ref 4.22–5.81)
RDW: 12.5 % (ref 11.5–15.5)
WBC: 10.6 10*3/uL — ABNORMAL HIGH (ref 4.0–10.5)

## 2016-11-07 MED ORDER — OXYCODONE HCL 5 MG PO TABS: 5.0000 mg | ORAL_TABLET | ORAL | 0 refills | Status: DC | PRN

## 2016-11-07 MED ORDER — METHOCARBAMOL 500 MG PO TABS: 500.0000 mg | ORAL_TABLET | Freq: Four times a day (QID) | ORAL | 1 refills | Status: DC | PRN

## 2016-11-07 MED ORDER — DOCUSATE SODIUM 100 MG PO CAPS: 100.0000 mg | ORAL_CAPSULE | Freq: Two times a day (BID) | ORAL | 0 refills | Status: DC

## 2016-11-07 MED ORDER — ASPIRIN 81 MG PO CHEW: 81.0000 mg | CHEWABLE_TABLET | Freq: Two times a day (BID) | ORAL | 0 refills | Status: DC

## 2016-11-07 NOTE — Evaluation (Signed)
Occupational Therapy Evaluation Patient Details Name: Preston Ortiz MRN: [id number] DOB: 09-18-1956 Today's Date: 11/07/2016    History of Present Illness 61 yo male s/p R THA-direct anterior 11/06/16   Clinical Impression   Pt was admitted for the above sx. All education was completed. No further OT is needed at this time    Follow Up Recommendations  No OT follow up    Equipment Recommendations  None recommended by OT    Recommendations for Other Services       Precautions / Restrictions Precautions Precautions: Fall Restrictions RLE Weight Bearing: Weight bearing as tolerated      Mobility Bed Mobility               General bed mobility comments: oob  Transfers   Equipment used: Rolling walker (2 wheeled)   Sit to Stand: Min guard         General transfer comment: cues for UE/LE placement    Balance                                            ADL Overall ADL's : Needs assistance/impaired     Grooming: Supervision/safety;Standing       Lower Body Bathing: Moderate assistance;Sit to/from stand       Lower Body Dressing: Maximal assistance;Sit to/from stand   Toilet Transfer: Min guard;Ambulation;Comfort height toilet;Grab bars;RW             General ADL Comments: practiced toilet transfer.  Pt will wait for wife to come to assist with adl; she has been helping prior to sx and will continue to do so.  Educated on tub bench--he is familiar with this.  also reviewed tub readiness.  Educated to work within pain tolerance, sidestepping through tight spaces and to keep RW in front of him     Vision     Perception     Praxis      Pertinent Vitals/Pain Pain Assessment: 0-10 Pain Score: 2  Pain Location: R hip Pain Descriptors / Indicators: Sore;Tightness Pain Intervention(s): Limited activity within patient's tolerance;Monitored during session;Repositioned;Patient requesting pain meds-RN notified;Ice applied      Hand Dominance     Extremity/Trunk Assessment Upper Extremity Assessment Upper Extremity Assessment: Overall WFL for tasks assessed           Communication Communication Communication: No difficulties   Cognition Arousal/Alertness: Awake/alert Behavior During Therapy: WFL for tasks assessed/performed Overall Cognitive Status: Within Functional Limits for tasks assessed                     General Comments       Exercises       Shoulder Instructions      Home Living Family/patient expects to be discharged to:: Private residence Living Arrangements: Spouse/significant other Available Help at Discharge: Family               Bathroom Shower/Tub: Tub/shower unit Shower/tub characteristics: Engineer, building services: Standard     Home Equipment: None   Additional Comments: pt has a high commode which has not been installed: he plans to have someone do this ASAP.  Doesn't feel he needs 3:1. Also has a grab bar by the toilet      Prior Functioning/Environment Level of Independence: Independent  OT Problem List:     OT Treatment/Interventions:      OT Goals(Current goals can be found in the care plan section) Acute Rehab OT Goals Patient Stated Goal: regain Ind OT Goal Formulation: All assessment and education complete, DC therapy  OT Frequency:     Barriers to D/C:            Co-evaluation              End of Session    Activity Tolerance: Patient tolerated treatment well Patient left: in chair;with call bell/phone within reach;with chair alarm set   Time: [id number] OT Time Calculation (min): 20 min Charges:  OT General Charges $OT Visit: 1 Procedure OT Evaluation $OT Eval Low Complexity: 1 Procedure G-Codes:    Preston Ortiz 11/07/2016, 8:31 AM  [name], OTR/L [phone] 11/07/2016

## 2016-11-07 NOTE — Care Management Note (Signed)
Case Management Note  Patient Details  Name: Preston Ortiz MRN: [id number] Date of Birth: [birth year]  Subjective/Objective:                  s/p R THA-direct anterior Action/Plan: Discharge planning Expected Discharge Date:  11/07/16               Expected Discharge Plan:  Home/Self Care  In-House Referral:     Discharge planning Services  CM Consult  Post Acute Care Choice:  Durable Medical Equipment Choice offered to:  Patient  DME Arranged:  3-N-1, Walker rolling DME Agency:  [facility]  HH Arranged:  NA HH Agency:  NA  Status of Service:  Completed, signed off  If discussed at Long Length of Stay Meetings, dates discussed:    Additional Comments: CM spoke with pt to confirm NO HHPT followup; pt confirms. Cm notified [facility] DME rep, Reggie to please deliver the rolling walker and 3n1 to room so pt can discharge.  No other CM needs were communicated. Yves DillJeffries, Pearson Picou Christine, RN 11/07/2016, 12:22 PM

## 2016-11-07 NOTE — Progress Notes (Signed)
Discharged from floor via w/c for transport home by car. Family & belongings with pt. No changes in assessment. Preston Ortiz  

## 2016-11-07 NOTE — Progress Notes (Signed)
Subjective: Patient doing well.  Pain is controlled.  He has walked in the halls and done steps.   Objective: Vital signs in last 24 hours: Temp:  [97.5 F (36.4 C)-99.5 F (37.5 C)] 97.5 F (36.4 C) (01/13 0939) Pulse Rate:  [68-85] 74 (01/13 0939) Resp:  [13-20] 15 (01/13 0939) BP: (117-172)/(76-92) 172/88 (01/13 0939) SpO2:  [93 %-100 %] 94 % (01/13 0939) Weight:  [283 lb (128.4 kg)] 283 lb (128.4 kg) (01/12 1205)  Intake/Output from previous day: 01/12 0701 - 01/13 0700 In: 1863.7 [P.O.:480; I.V.:1333.7; IV Piggyback:50] Out: 2100 [Urine:2000; Blood:100] Intake/Output this shift: Total I/O In: 240 [P.O.:240] Out: 150 [Urine:150]  Exam:  Sensation intact distally Dorsiflexion/Plantar flexion intact  Labs:  Recent Labs  11/07/16 0505  HGB 12.1*    Recent Labs  11/07/16 0505  WBC 10.6*  RBC 3.77*  HCT 34.6*  PLT 286    Recent Labs  11/07/16 0505  NA 137  K 4.9  CL 101  CO2 27  BUN 19  CREATININE 0.94  GLUCOSE 164*  CALCIUM 9.4   No results for input(s): LABPT, INR in the last 72 hours.  Assessment/Plan: Plan discharge today.  Patient has good support network at home   Mirant Scott Dean 11/07/2016, 10:21 AM

## 2016-11-07 NOTE — Progress Notes (Signed)
Foley cath removed at 0610. Patient tolerated procedure. 

## 2016-11-07 NOTE — Progress Notes (Addendum)
Physical Therapy Treatment Patient Details Name: Preston Ortiz MRN: [id number] DOB: [birth year] Today's Date: 11/07/2016    History of Present Illness 61 yo male s/p R THA-direct anterior 11/06/16    PT Comments    Progressing well with mobility. Reviewed/practiced gait training, exercises, and stair training. Pt walked entire length of hallway earlier this morning. All education completed. Ready to d/c from PT standpoint.   Follow Up Recommendations  No PT follow up;Supervision for mobility/OOB     Equipment Recommendations  Rolling walker with 5" wheels    Recommendations for Other Services       Precautions / Restrictions Precautions Precautions: None Restrictions Weight Bearing Restrictions: No RLE Weight Bearing: Weight bearing as tolerated    Mobility  Bed Mobility               General bed mobility comments: oob  Transfers Overall transfer level: Needs assistance Equipment used: Rolling walker (2 wheeled) Transfers: Sit to/from Stand Sit to Stand: Supervision         General transfer comment: cues for UE/LE placement  Ambulation/Gait Ambulation/Gait assistance: Supervision Ambulation Distance (Feet): 100 Feet Assistive device: Rolling walker (2 wheeled) Gait Pattern/deviations: Step-through pattern;Decreased stride length     General Gait Details: for safety.    Stairs Stairs: Yes   Stair Management: One rail Right;Step to pattern;Forwards Number of Stairs: 2 General stair comments: close guard for safety. VCs safety, technique, sequence.   Wheelchair Mobility    Modified Rankin (Stroke Patients Only)       Balance                                    Cognition Arousal/Alertness: Awake/alert Behavior During Therapy: WFL for tasks assessed/performed Overall Cognitive Status: Within Functional Limits for tasks assessed                      Exercises Total Joint Exercises Quad Sets: AROM;Both;10  reps;Seated Hip ABduction/ADduction: AROM;Right;10 reps;Standing Long Arc Quad: AROM;Right;Seated Knee Flexion: AROM;Right;10 reps;Seated Marching in Standing: AROM;Both;10 reps;Standing General Exercises - Lower Extremity Heel Raises: AROM;Both;10 reps;Standing    General Comments        Pertinent Vitals/Pain Pain Assessment: 0-10 Pain Score: 3  Pain Location: R anterior/lateral thigh Pain Descriptors / Indicators: Sore;Tightness Pain Intervention(s): Monitored during session;Ice applied    Home Living Family/patient expects to be discharged to:: Private residence Living Arrangements: Spouse/significant other Available Help at Discharge: Family         Home Equipment: None Additional Comments: pt has a high commode which has not been installed: he plans to have someone do this ASAP.  Doesn't feel he needs 3:1. Also has a grab bar by the toilet    Prior Function Level of Independence: Independent          PT Goals (current goals can now be found in the care plan section) Acute Rehab PT Goals Patient Stated Goal: regain Ind Progress towards PT goals: Progressing toward goals    Frequency    7X/week      PT Plan Current plan remains appropriate    Co-evaluation             End of Session Equipment Utilized During Treatment: Gait belt Activity Tolerance: Patient tolerated treatment well Patient left: in chair;with call bell/phone within reach     Time: 0939-1004 PT Time Calculation (min) (ACUTE ONLY): 25 min  Charges:  $  Gait Training: 8-22 mins $Therapeutic Exercise: 8-22 mins                    G Codes:      Weston Anna, MPT Pager: [phone]

## 2016-11-09 NOTE — Op Note (Signed)
NAMEMarland Kitchen  Preston Ortiz, Preston Ortiz NO.:  [id number]  MEDICAL RECORD NO.:  [id number]  LOCATION:                                 FACILITY:  PHYSICIAN:  Preston Panda. Magnus Ivan, M.D.DATE OF BIRTH:  [birth year]  DATE OF PROCEDURE:  11/06/2016 DATE OF DISCHARGE:                              OPERATIVE REPORT   PREOPERATIVE DIAGNOSES:  Primary osteoarthritis and degenerative joint disease, right hip.  POSTOPERATIVE DIAGNOSES:  Primary osteoarthritis and degenerative joint disease, right hip.  PROCEDURE:  Right total hip arthroplasty through direct anterior approach.  IMPLANTS:  DePuy Sector Gription acetabular component size 58, size 36+ 4 polyethylene liner, size 11 Corail femoral component with varus offset, size 36+ 5 ceramic hip ball.  SURGEON:  Preston Panda. Magnus Ivan, M.D.  ASSISTANT:  Richardean Canal, PA-C.  ANESTHESIA:  Spinal.  ANTIBIOTICS:  900 mg of IV clindamycin.  BLOOD LOSS:  100 mL.  COMPLICATIONS:  None.  INDICATIONS:  [name] is a 61 year old gentleman with debilitating arthritis involving his right hip.  His x-rays look completely awful. He has complete joint space loss, severe periarticular osteophytes, and even a shortened leg.  He has severe sclerotic changes and cystic changes as well.  He has tried and failed all forms of conservative treatment.  His pain is daily, and has detrimentally affected his activities of daily living, and his quality of life.  At this point, he does wish to proceed with a total hip arthroplasty through direct anterior approach.  We talked to him in detail about the risks of acute blood loss anemia, nerve and vessel injury, fracture, infection, dislocation, and DVT.  He understands our goals are decreased pain, improved mobility, and overall improved quality of life.  PROCEDURE DESCRIPTION:  After informed consent was obtained, appropriate right hip was marked.  He was brought to the operating room where  spinal anesthesia was obtained while he was on the stretcher.  He was then laid in a supine position on the stretcher.  A Foley catheter was placed and both feet had traction boots applied to them.  He was then placed supine on the [facility]NA fracture table with the perineal post in place and both legs in inline skeletal traction devices, but no traction applied.  His right operative hip was then prepped and draped with DuraPrep and sterile drapes.  A time-out was called, he was identified as correct patient, correct right hip.  We then made an incision just inferior and posterior to the anterior superior iliac spine and carried this obliquely down the leg.  I dissected down the tensor fascia lata muscle and tensor fascia was then divided longitudinally to proceed with a direct anterior approach to the hip.  We identified and cauterized the circumflex vessels and identified the hip capsule.  I opened the hip capsule in an L-type format finding a significant joint effusion as well as significant periarticular osteophytes.  We then made our femoral neck cut with an oscillating saw proximal to the lesser trochanter and completed this with an osteotome.  We placed a corkscrew guide in the femoral head and removed the femoral head in its entirety and found it to be significantly deformed and completely devoid of  cartilage.  We then placed a bent Hohmann over the medial acetabular rim and removed remnants of acetabular labrum.  We then began reaming from a size 42 reamer all the way up to a size 58 with all reamers under direct visualization and the last reamer under direct fluoroscopy, so we could obtain our depth of reaming, our inclination, and anteversion.  From there, we then placed the real DePuy Sector Gription acetabular component size 58 and a 36+ 4 polyethylene liner based on his anatomy. Attention was then turned to the femur.  With the leg externally rotated to 120 degrees, extended and  adducted, we were able to place a Mueller retractor medially and a Hohmann retractor behind the greater trochanter.  We released the lateral joint capsule and used a box cutting osteotome to enter the femoral canal and a rongeur to lateralize.  We then began broaching from a size 8 broach using the Corail broaching system up to a size 11.  With the size 11 in place, we trialed a varus offset femoral neck again based off his anatomy and a 36+ 1.5 hip ball.  We brought the leg back over and up with traction and internal rotation reducing the pelvis.  We were pleased with the stability, but we felt like we needed a little a bit more offset and leg length.  We dislocated the hip and removed the trial components.  We were then able to place the real Corail femoral component with varus offset size 11 and a real 36+ 5 ceramic hip ball.  We reduced this in the acetabulum.  We were pleased with leg length, offset, stability, and range of motion.  We then irrigated the soft tissue with normal saline solution.  We were able to close the joint capsule with interrupted #1 Ethibond suture followed by running #1 Vicryl in the tensor fascia, 0- Vicryl in deep tissue, 2-0 Vicryl in the subcutaneous tissue, and interrupted staples on the skin.  Xeroform and Aquacel dressing were applied.  He was taken off the HANA table, taken to the recovery room in stable condition.  All final counts were correct.  There were no complications noted.  Of note, Richardean CanalGilbert Clark, PA-C assisted in the entire case.  His assistance was crucial for facilitating all aspects of this case.     Preston Ortiz, M.D.   ______________________________ Preston Ortiz, M.D.    CYB/MEDQ  D:  11/06/2016  T:  11/07/2016  Job:  [id number]

## 2016-11-09 NOTE — Discharge Summary (Signed)
Patient ID: Preston Ortiz MRN: [id number] DOB/AGE: 60-Aug-1957 59 y.o.  Admit date: 11/06/2016 Discharge date: 11/09/2016  Admission Diagnoses:  Principal Problem:   Unilateral primary osteoarthritis, right hip Active Problems:   Status post total replacement of right hip   Discharge Diagnoses:  Same  Past Medical History:  Diagnosis Date  . Arthritis    osteoathritis  . Dyspnea    pain related  . GERD (gastroesophageal reflux disease)    ocassional  . Hypertension    pain related per patient    Surgeries: Procedure(s): RIGHT TOTAL HIP ARTHROPLASTY ANTERIOR APPROACH on 11/06/2016   Consultants:   Discharged Condition: Improved  Hospital Course: Preston Ortiz is an 61 y.o. male who was admitted 11/06/2016 for operative treatment ofUnilateral primary osteoarthritis, right hip. Patient has severe unremitting pain that affects sleep, daily activities, and work/hobbies. After pre-op clearance the patient was taken to the operating room on 11/06/2016 and underwent  Procedure(s): RIGHT TOTAL HIP ARTHROPLASTY ANTERIOR APPROACH.    Patient was given perioperative antibiotics: Anti-infectives    Start     Dose/Rate Route Frequency Ordered Stop   11/06/16 1600  clindamycin (CLEOCIN) IVPB 600 mg     600 mg 100 mL/hr over 30 Minutes Intravenous Every 6 hours 11/06/16 1217 11/06/16 2142   11/06/16 0755  clindamycin (CLEOCIN) IVPB 900 mg     900 mg 100 mL/hr over 30 Minutes Intravenous On call to O.R. 11/06/16 0755 11/06/16 0940       Patient was given sequential compression devices, early ambulation, and chemoprophylaxis to prevent DVT.  Patient benefited maximally from hospital stay and there were no complications.    Recent vital signs: No data found.    Recent laboratory studies:  Recent Labs  11/07/16 0505  WBC 10.6*  HGB 12.1*  HCT 34.6*  PLT 286  NA 137  K 4.9  CL 101  CO2 27  BUN 19  CREATININE 0.94  GLUCOSE 164*  CALCIUM 9.4     Discharge  Medications:   Allergies as of 11/07/2016      Reactions   Cyclobenzaprine    Blurred vision    Gabapentin    Bloody stools    Penicillins    childhood allergy  Has patient had a PCN reaction causing immediate rash, facial/tongue/throat swelling, SOB or lightheadedness with hypotension: Unknown Has patient had a PCN reaction causing severe rash involving mucus membranes or skin necrosis: Unknown Has patient had a PCN reaction that required hospitalization Unknown Has patient had a PCN reaction occurring within the last 10 years: No If all of the above answers are "NO", then may proceed with Cephalosporin use.      Medication List    TAKE these medications   amLODipine 10 MG tablet Commonly known as:  NORVASC Take 10 mg by mouth daily.   aspirin 81 MG chewable tablet Chew 1 tablet (81 mg total) by mouth 2 (two) times daily.   docusate sodium 100 MG capsule Commonly known as:  COLACE Take 1 capsule (100 mg total) by mouth 2 (two) times daily.   methocarbamol 500 MG tablet Commonly known as:  ROBAXIN Take 1 tablet (500 mg total) by mouth every 6 (six) hours as needed for muscle spasms.   oxyCODONE 5 MG immediate release tablet Commonly known as:  Oxy IR/ROXICODONE Take 1-2 tablets (5-10 mg total) by mouth every 3 (three) hours as needed for breakthrough pain.       Diagnostic Studies: Dg Pelvis Portable  Result Date:  11/06/2016 CLINICAL DATA:  Status post total hip replacement EXAM: PORTABLE PELVIS 1-2 VIEWS COMPARISON:  Intraoperative study obtained earlier in the day FINDINGS: There is a total hip replacement on the right with prosthetic components appearing well-seated on frontal view. No acute fracture or dislocation is evident. There is moderate osteoarthritic change in the left hip joint. Air on the right is an expected postoperative finding. IMPRESSION: Total hip replacement on the right with prosthetic components appearing well-seated on frontal view. No acute fracture  or dislocation evident. Osteoarthritic change left hip joint. Electronically Signed   By: Bretta BangWilliam  Woodruff III M.D.   On: 11/06/2016 11:23   Dg C-arm 1-60 Min-no Report  Result Date: 11/06/2016 There is no Radiologist interpretation  for this exam.  Dg Hip Operative Unilat W Or W/o Pelvis Right  Result Date: 11/06/2016 CLINICAL DATA:  Right hip replaced EXAM: OPERATIVE RIGHT HIP (WITH PELVIS IF PERFORMED) 1 VIEWS TECHNIQUE: Fluoroscopic spot image(s) were submitted for interpretation post-operatively. COMPARISON:  None. FINDINGS: Right total hip arthroplasty is in place. Anatomic alignment. No breakage or loosening of the hardware. IMPRESSION: Right total hip arthroplasty anatomically aligned. Electronically Signed   By: Jolaine ClickArthur  Hoss M.D.   On: 11/06/2016 10:42    Disposition: 01-Home or Self Care  Discharge Instructions    Call MD / Call 911    Complete by:  As directed    If you experience chest pain or shortness of breath, CALL 911 and be transported to the hospital emergency room.  If you develope a fever above 101 F, pus (white drainage) or increased drainage or redness at the wound, or calf pain, call your surgeon's office.   Constipation Prevention    Complete by:  As directed    Drink plenty of fluids.  Prune juice may be helpful.  You may use a stool softener, such as Colace (over the counter) 100 mg twice a day.  Use MiraLax (over the counter) for constipation as needed.   Diet - low sodium heart healthy    Complete by:  As directed    Increase activity slowly as tolerated    Complete by:  As directed       Follow-up Information    Inc[facility] - [facility] Follow up.   Why:  rolling walker and 3n1(over the commode seat) Contact information: [address] [place] [address] [phone]            Signed: Kathryne HitchChristopher Y Norelle Runnion 11/09/2016, 11:21 AM

## 2016-11-17 ENCOUNTER — Telehealth (INDEPENDENT_AMBULATORY_CARE_PROVIDER_SITE_OTHER): Payer: Self-pay | Admitting: *Deleted

## 2016-11-17 NOTE — Telephone Encounter (Signed)
Pt called stating he is retaining fluids and has done everything that he can think of. Resting, elevation, drinking water. Pt asking for advice on what else he should do to help.

## 2016-11-17 NOTE — Telephone Encounter (Signed)
Please advise 

## 2016-11-18 NOTE — Telephone Encounter (Signed)
Pt called back with questions about VM that was left.

## 2016-11-18 NOTE — Telephone Encounter (Signed)
Only other thing to help is mobility.  He may need to see his PCP to make sure it is not a blood pressure, heart, or kidney issue and whether or not lasix (fluid pill) would be ok.

## 2016-11-18 NOTE — Telephone Encounter (Signed)
LMOM for patient that I was calling him back, and to actually leave questions with the receptionist, sincewe keep missing each other. I will call him back with the answers to his questions

## 2016-11-18 NOTE — Telephone Encounter (Signed)
LMOM for patient of the below message from Dr. Blackman  

## 2016-11-19 ENCOUNTER — Inpatient Hospital Stay (INDEPENDENT_AMBULATORY_CARE_PROVIDER_SITE_OTHER): Payer: Self-pay | Admitting: Orthopaedic Surgery

## 2016-11-26 ENCOUNTER — Encounter (INDEPENDENT_AMBULATORY_CARE_PROVIDER_SITE_OTHER): Payer: Self-pay | Admitting: Physician Assistant

## 2016-11-26 ENCOUNTER — Ambulatory Visit (INDEPENDENT_AMBULATORY_CARE_PROVIDER_SITE_OTHER): Payer: MEDICAID | Admitting: Physician Assistant

## 2016-11-26 DIAGNOSIS — Z96641 Presence of right artificial hip joint: Secondary | ICD-10-CM

## 2016-11-26 MED ORDER — METHOCARBAMOL 500 MG PO TABS: 500.0000 mg | ORAL_TABLET | Freq: Four times a day (QID) | ORAL | 1 refills | Status: DC | PRN

## 2016-11-26 MED ORDER — OXYCODONE HCL 5 MG PO TABS: 5.0000 mg | ORAL_TABLET | ORAL | 0 refills | Status: DC | PRN

## 2016-11-26 NOTE — Progress Notes (Signed)
Office Visit Note   Patient: Preston Ortiz           Date of Birth: 1956/04/10           MRN: [id number] Visit Date: 11/26/2016              Requested by: Jerrell Belfast, FNP [address] [place], [place] [address] PCP: Jerrell Belfast, FNP   Assessment & Plan: Visit Diagnoses:  1. Status post total replacement of right hip     Plan: Staples removed right hip, he'll work on scar tissue mobilization.. Continue to work on gait balance overall strengthening of the right hip. Follow-up in one month check his progress lack of. Seroma was aspirated today total of 75 mL of normal serosanguineous fluid was aspirated. He will take 81 mg aspirin once a day now for 1 week and then discontinue as he was on no aspirin prior surgery  Follow-Up Instructions: No Follow-up on file.   Orders:  No orders of the defined types were placed in this encounter.  Meds ordered this encounter  Medications  . oxyCODONE (OXY IR/ROXICODONE) 5 MG immediate release tablet    Sig: Take 1-2 tablets (5-10 mg total) by mouth every 4 (four) hours as needed for breakthrough pain.    Dispense:  60 tablet    Refill:  0  . methocarbamol (ROBAXIN) 500 MG tablet    Sig: Take 1 tablet (500 mg total) by mouth every 6 (six) hours as needed for muscle spasms.    Dispense:  40 tablet    Refill:  1      Procedures: No procedures performed   Clinical Data: No additional findings.   Subjective: Chief Complaint  Patient presents with  . Right Hip - Routine Post Op  . Follow-up    HPI [name] returns today status post right total hip arthroplasty 20 days postop. Overall is doing well. Had no fevers chills no shortness breath no significant calf pain. He's been on aspirin twice daily. His very pleased thus far with the results of the right hip arthroplasty and the fact that now his leg lengths are equal. Review of Systems   Objective: Vital Signs: There were no vitals taken for this visit.  Physical Exam    Constitutional: He is oriented to person, place, and time. He appears well-developed and well-nourished. No distress.  Pulmonary/Chest: Effort normal.  Neurological: He is alert and oriented to person, place, and time.    Ortho Exam Right hip surgical incisions healing well well approximated with staples no signs of infection. He does have a seroma. No erythema ecchymosis about the hip. He has overall good range of motion hip. Leg lengths are equal. Right calf supple nontender Specialty Comments:  No specialty comments available.  Imaging: No results found.   PMFS History: Patient Active Problem List   Diagnosis Date Noted  . Unilateral primary osteoarthritis, right hip 11/06/2016  . Status post total replacement of right hip 11/06/2016   Past Medical History:  Diagnosis Date  . Arthritis    osteoathritis  . Dyspnea    pain related  . GERD (gastroesophageal reflux disease)    ocassional  . Hypertension    pain related per patient    Family History  Problem Relation Age of Onset  . Cancer Mother   . CAD Neg Hx     Past Surgical History:  Procedure Laterality Date  . TONSILLECTOMY     chilhood  . TOTAL HIP  ARTHROPLASTY Right 11/06/2016   Procedure: RIGHT TOTAL HIP ARTHROPLASTY ANTERIOR APPROACH;  Surgeon: Kathryne Hitchhristopher Y Blackman, MD;  Location: [facility];  Service: Orthopedics;  Laterality: Right;   Social History   Occupational History  . Not on file.   Social History Main Topics  . Smoking status: Current Every Day Smoker    Packs/day: 1.00    Years: 40.00    Types: Cigarettes  . Smokeless tobacco: Never Used  . Alcohol use Yes     Comment: social drinker  . Drug use: No  . Sexual activity: Yes

## 2016-12-24 ENCOUNTER — Ambulatory Visit (INDEPENDENT_AMBULATORY_CARE_PROVIDER_SITE_OTHER): Payer: Self-pay | Admitting: Orthopaedic Surgery

## 2016-12-24 DIAGNOSIS — M1611 Unilateral primary osteoarthritis, right hip: Secondary | ICD-10-CM

## 2016-12-24 MED ORDER — TRAMADOL HCL 50 MG PO TABS: 100.0000 mg | ORAL_TABLET | Freq: Four times a day (QID) | ORAL | 0 refills | Status: DC | PRN

## 2016-12-24 NOTE — Progress Notes (Signed)
The patient is now 7 weeks status post a right direct anterior hip replacement. He is an Personnel officerelectrician. He is looking to get back to work saying. He says he is ready to start going with less strong pain medicine as well. He is someone to walk with such a Trendelenburg gait before he is walking with just a mild limp now and is getting back to more normal gait since his leg lengths are back on. He has some mild to moderate pain but overall says is doing well.  On examination his leg was are equal. He tolerated the easily putting his right hip the range of motion with some moderate pain.  I'm going to give him a one-time patrician for hydrocodone but were also on: Some tramadol he was to try to tramadol first not very with this. Hydrocodone should be just as he needs it if things get severe in the evening. I really don't need to see him back for 6 months at this point since he is doing so well. At that visit I would like a low AP pelvis.

## 2017-07-01 ENCOUNTER — Ambulatory Visit (INDEPENDENT_AMBULATORY_CARE_PROVIDER_SITE_OTHER): Payer: Self-pay | Admitting: Orthopaedic Surgery

## 2020-01-18 ENCOUNTER — Ambulatory Visit: Payer: Self-pay | Attending: Internal Medicine

## 2020-01-18 DIAGNOSIS — Z23 Encounter for immunization: Secondary | ICD-10-CM

## 2020-01-18 NOTE — Progress Notes (Signed)
   Covid-19 Vaccination Clinic  Name:  Preston Ortiz    MRN: [id number] DOB: 05-Oct-1956  01/18/2020  [name] was observed post Covid-19 immunization for 30 minutes based on pre-vaccination screening without incident. He was provided with Vaccine Information Sheet and instruction to access the V-Safe system.   [name] was instructed to call 911 with any severe reactions post vaccine: Marland Kitchen Difficulty breathing  . Swelling of face and throat  . A fast heartbeat  . A bad rash all over body  . Dizziness and weakness   Immunizations Administered    Name Date Dose VIS Date Route   Moderna COVID-19 Vaccine 01/18/2020 10:20 AM 0.5 mL 09/26/2019 Intramuscular   Manufacturer: Moderna   Lot: [id number]   NDC: 50093-818-29

## 2020-01-25 ENCOUNTER — Other Ambulatory Visit: Payer: Self-pay

## 2020-01-25 ENCOUNTER — Ambulatory Visit
Admission: EM | Admit: 2020-01-25 | Discharge: 2020-01-25 | Disposition: A | Discharge status: Home / Self Care | Payer: Self-pay | Attending: Emergency Medicine | Admitting: Emergency Medicine

## 2020-01-25 ENCOUNTER — Encounter: Payer: Self-pay | Admitting: Emergency Medicine

## 2020-01-25 DIAGNOSIS — I10 Essential (primary) hypertension: Secondary | ICD-10-CM

## 2020-01-25 MED ORDER — AMLODIPINE BESYLATE 10 MG PO TABS: 10.0000 mg | ORAL_TABLET | Freq: Every day | ORAL | 0 refills | Status: DC

## 2020-01-25 NOTE — ED Provider Notes (Signed)
[facility]    CSN: [id number] Arrival date & time: 01/25/20  0843      History   Chief Complaint Chief Complaint  Patient presents with  . Hypertension    HPI Preston Ortiz is a 64 y.o. male.   Who presents to the urgent care for complaint of elevated blood pressure.  Hx of HTN x for more than 10 years .  States blood pressure on average is 160/90. Used to take amlodipine 10 mg daily couple years ago.  Has stopped taking this medication for more than 5 years.  Currently does not have a PCP.  Denies HA, vision changes, dizziness, lightheadedness, chest pain, shortness of breath, numbness or tingling in extremities, abdominal pain, changes in bowel or bladder habits.       Past Medical History:  Diagnosis Date  . Arthritis    osteoathritis  . Dyspnea    pain related  . GERD (gastroesophageal reflux disease)    ocassional  . Hypertension    pain related per patient    Patient Active Problem List   Diagnosis Date Noted  . Unilateral primary osteoarthritis, right hip 11/06/2016  . 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 TONSILLECTOMY     chilhood  . TOTAL HIP ARTHROPLASTY Right 11/06/2016   Procedure: RIGHT TOTAL HIP ARTHROPLASTY ANTERIOR APPROACH;  Surgeon: Kathryne Hitch, MD;  Location: [facility];  Service: Orthopedics;  Laterality: Right;       Home Medications    Prior to Admission medications   Medication Sig Start Date End Date Taking? Authorizing Provider  amLODipine (NORVASC) 10 MG tablet Take 1 tablet (10 mg total) by mouth daily. 01/25/20   Maui Britten, Zachery Dakins, FNP  aspirin 81 MG chewable tablet Chew 1 tablet (81 mg total) by mouth 2 (two) times daily. 11/07/16   Cammy Copa, MD  docusate sodium (COLACE) 100 MG capsule Take 1 capsule (100 mg total) by mouth 2 (two) times daily. 11/07/16   Cammy Copa, MD  methocarbamol (ROBAXIN) 500 MG tablet Take 1  tablet (500 mg total) by mouth every 6 (six) hours as needed for muscle spasms. 11/26/16   Kirtland Bouchard, PA-C  oxyCODONE (OXY IR/ROXICODONE) 5 MG immediate release tablet Take 1-2 tablets (5-10 mg total) by mouth every 4 (four) hours as needed for breakthrough pain. 11/26/16   Kirtland Bouchard, PA-C  traMADol (ULTRAM) 50 MG tablet Take 2 tablets (100 mg total) by mouth every 6 (six) hours as needed. 12/24/16   Kathryne Hitch, MD    Family History Family History  Problem Relation Age of Onset  . Cancer Mother   . CAD Neg Hx     Social History Social History   Tobacco Use  . Smoking status: Current Every Day Smoker    Packs/day: 1.00    Years: 40.00    Pack years: 40.00    Types: Cigarettes  . Smokeless tobacco: Never Used  Substance Use Topics  . Alcohol use: Yes    Comment: social drinker  . Drug use: No     Allergies   Cyclobenzaprine, Gabapentin, and Penicillins   Review of Systems Review of Systems  Constitutional: Negative.   Respiratory: Negative.   Cardiovascular: Negative.   Neurological: Negative.   All other systems reviewed and are negative.    Physical Exam Triage Vital Signs ED Triage Vitals [01/25/20 0851]  Enc Vitals Group  BP      Pulse      Resp      Temp      Temp src      SpO2      Weight      Height      Head Circumference      Peak Flow      Pain Score 0     Pain Loc      Pain Edu?      Excl. in GC?    No data found.  Updated Vital Signs BP (!) 165/82   Temp 98.4 F (36.9 C)   Resp 18   SpO2 95%   Visual Acuity Right Eye Distance:   Left Eye Distance:   Bilateral Distance:    Right Eye Near:   Left Eye Near:    Bilateral Near:     Physical Exam Vitals and nursing note reviewed.  Constitutional:      General: He is not in acute distress.    Appearance: Normal appearance. He is normal weight. He is not ill-appearing, toxic-appearing or diaphoretic.  HENT:     Head: Normocephalic.     Right Ear: Tympanic  membrane, ear canal and external ear normal. There is no impacted cerumen.     Left Ear: Tympanic membrane, ear canal and external ear normal. There is no impacted cerumen.  Cardiovascular:     Rate and Rhythm: Normal rate and regular rhythm.     Pulses: Normal pulses.     Heart sounds: Normal heart sounds. No murmur. No friction rub. No gallop.   Pulmonary:     Effort: Pulmonary effort is normal. No respiratory distress.     Breath sounds: Normal breath sounds. No stridor. No wheezing, rhonchi or rales.  Chest:     Chest wall: No tenderness.  Neurological:     Mental Status: He is alert and oriented to person, place, and time.      UC Treatments / Results  Labs (all labs ordered are listed, but only abnormal results are displayed) Labs Reviewed  COMPREHENSIVE METABOLIC PANEL  CBC    EKG   Radiology No results found.  Procedures Procedures (including critical care time)  Medications Ordered in UC Medications - No data to display  Initial Impression / Assessment and Plan / UC Course  I have reviewed the triage vital signs and the nursing notes.  Pertinent labs & imaging results that were available during my care of the patient were reviewed by me and considered in my medical decision making (see chart for details).    Patient is stable for discharge CBC and CMP will be completed. We we will give him 90 tablets of amlodipine 10 mg daily.  He will have enough medication until he establish care with a PCP. PCP resource was provided.   Final Clinical Impressions(s) / UC Diagnoses   Final diagnoses:  Essential hypertension     Discharge Instructions     Please continue to monitor blood pressure at home and keep a log Eat a well balanced diet of fruits, vegetables and lean meats.  Avoid foods high in fat and salt Drink water.  At least half your body weight in ounces Exercise for at least 30 minutes daily Follow up with PCP/primary care resource was  provided Return or go to the ED if you have any new or worsening symptoms such as vision changes, fatigue, dizziness, chest pain, shortness of breath, nausea, swelling in your hands or feet,  urinary symptoms, etc...     ED Prescriptions    Medication Sig Dispense Auth. Provider   amLODipine (NORVASC) 10 MG tablet Take 1 tablet (10 mg total) by mouth daily. 90 tablet Charlye Spare, Darrelyn Hillock, FNP     PDMP not reviewed this encounter.   Emerson Monte, [place] 01/25/20 [phone]

## 2020-01-25 NOTE — ED Triage Notes (Signed)
Pt presents with c/o elevated BP for past week, hasn't taken BP meds in years

## 2020-01-25 NOTE — Discharge Instructions (Addendum)
Please continue to monitor blood pressure at home and keep a log Eat a well balanced diet of fruits, vegetables and lean meats.  Avoid foods high in fat and salt Drink water.  At least half your body weight in ounces Exercise for at least 30 minutes daily Follow up with PCP/primary care resource was provided Return or go to the ED if you have any new or worsening symptoms such as vision changes, fatigue, dizziness, chest pain, shortness of breath, nausea, swelling in your hands or feet, urinary symptoms, etc..Marland Kitchen

## 2020-01-26 LAB — CBC
Hematocrit: 43.9 % (ref 37.5–51.0)
Hemoglobin: 15.4 g/dL (ref 13.0–17.7)
MCH: 33.4 pg — ABNORMAL HIGH (ref 26.6–33.0)
MCHC: 35.1 g/dL (ref 31.5–35.7)
MCV: 95 fL (ref 79–97)
Platelets: 226 10*3/uL (ref 150–450)
RBC: 4.61 x10E6/uL (ref 4.14–5.80)
RDW: 12.7 % (ref 11.6–15.4)
WBC: 7.2 10*3/uL (ref 3.4–10.8)

## 2020-01-26 LAB — COMPREHENSIVE METABOLIC PANEL
ALT: 16 IU/L (ref 0–44)
AST: 15 IU/L (ref 0–40)
Albumin/Globulin Ratio: 1.6 (ref 1.2–2.2)
Albumin: 4.4 g/dL (ref 3.8–4.8)
Alkaline Phosphatase: 85 IU/L (ref 39–117)
BUN/Creatinine Ratio: 12 (ref 10–24)
BUN: 12 mg/dL (ref 8–27)
Bilirubin Total: 0.3 mg/dL (ref 0.0–1.2)
CO2: 19 mmol/L — ABNORMAL LOW (ref 20–29)
Calcium: 9.3 mg/dL (ref 8.6–10.2)
Chloride: 102 mmol/L (ref 96–106)
Creatinine, Ser: 0.98 mg/dL (ref 0.76–1.27)
GFR calc Af Amer: 95 mL/min/{1.73_m2} (ref >59)
GFR calc non Af Amer: 82 mL/min/{1.73_m2} (ref >59)
Globulin, Total: 2.8 g/dL (ref 1.5–4.5)
Glucose: 104 mg/dL — ABNORMAL HIGH (ref 65–99)
Potassium: 5 mmol/L (ref 3.5–5.2)
Sodium: 138 mmol/L (ref 134–144)
Total Protein: 7.2 g/dL (ref 6.0–8.5)

## 2020-02-20 ENCOUNTER — Ambulatory Visit: Payer: Self-pay | Attending: Internal Medicine

## 2020-02-20 DIAGNOSIS — Z23 Encounter for immunization: Secondary | ICD-10-CM

## 2020-02-20 NOTE — Progress Notes (Signed)
   Covid-19 Vaccination Clinic  Name:  Preston Ortiz    MRN: [id number] DOB: 02/14/1956  02/20/2020  Preston Ortiz was observed post Covid-19 immunization for 15 minutes without incident. He was provided with Vaccine Information Sheet and instruction to access the V-Safe system.   Preston Ortiz was instructed to call 911 with any severe reactions post vaccine: . Difficulty breathing  . Swelling of face and throat  . A fast heartbeat  . A bad rash all over body  . Dizziness and weakness   Immunizations Administered    Name Date Dose VIS Date Route   Moderna COVID-19 Vaccine 02/20/2020 12:20 PM 0.5 mL 09/2019 Intramuscular   Manufacturer: Moderna   Lot: [id number]   NDC: 80777-273-99     

## 2020-02-20 NOTE — Progress Notes (Signed)
   Covid-19 Vaccination Clinic  Name:  Preston Ortiz    MRN: [id number] DOB: 1956/05/28  02/20/2020  Preston Ortiz was observed post Covid-19 immunization for 15 minutes without incident. He was provided with Vaccine Information Sheet and instruction to access the V-Safe system.   Preston Ortiz was instructed to call 911 with any severe reactions post vaccine: Marland Kitchen Difficulty breathing  . Swelling of face and throat  . A fast heartbeat  . A bad rash all over body  . Dizziness and weakness   Immunizations Administered    Name Date Dose VIS Date Route   Moderna COVID-19 Vaccine 02/20/2020 12:20 PM 0.5 mL 09/2019 Intramuscular   Manufacturer: Moderna   Lot: [id number]   NDC: 86754-492-01

## 2020-04-26 ENCOUNTER — Ambulatory Visit: Payer: Self-pay | Admitting: Orthopedic Surgery

## 2020-05-07 ENCOUNTER — Ambulatory Visit: Payer: Self-pay | Admitting: Orthopedic Surgery

## 2020-05-07 NOTE — H&P (Deleted)
  The note originally documented on this encounter has been moved the the encounter in which it belongs.  

## 2020-05-07 NOTE — H&P (Signed)
Subjective:   Preston Ortiz is a ppleasant 64 year old male with past medical history significant for hypertension and tobacco use disorder. He is being followed for cervical radiculopathy. He has pain that radiates into his Right arm. He has failed conservative treatment including injection therapy, Over-the-counter, and opioid medications. He would therefore like to move forward with surgical intervention. The patient is here today for a pre-operative History and Physical. They are scheduled for ACDF C5-7 on 05/16/2020 with Dr. Shon Baton at [facility].  Patient Active Problem List   Diagnosis Date Noted  . Unilateral primary osteoarthritis, right hip 11/06/2016  . Status post total replacement of right hip 11/06/2016   Past Medical History:  Diagnosis Date  . Arthritis    osteoathritis  . Dyspnea    pain related  . GERD (gastroesophageal reflux disease)    ocassional  . Hypertension    pain related per patient    Past Surgical History:  Procedure Laterality Date  . TONSILLECTOMY     chilhood  . TOTAL HIP ARTHROPLASTY Right 11/06/2016   Procedure: RIGHT TOTAL HIP ARTHROPLASTY ANTERIOR APPROACH;  Surgeon: Kathryne Hitch, MD;  Location: [facility];  Service: Orthopedics;  Laterality: Right;    Current Outpatient Medications  Medication Sig Dispense Refill Last Dose  . amLODipine (NORVASC) 10 MG tablet Take 1 tablet (10 mg total) by mouth daily. 90 tablet 0   . Doxylamine Succinate, Sleep, (SLEEP AID PO) Take 1 tablet by mouth at bedtime as needed (sleep).      No current facility-administered medications for this visit.   Allergies  Allergen Reactions  . Cyclobenzaprine     Blurred vision   . Gabapentin     Bloody stools   . Penicillins     childhood allergy  Has patient had a PCN reaction causing immediate rash, facial/tongue/throat swelling, SOB or lightheadedness with hypotension: Unknown Has patient had a PCN reaction causing severe rash involving mucus membranes or  skin necrosis: Unknown Has patient had a PCN reaction that required hospitalization Unknown Has patient had a PCN reaction occurring within the last 10 years: No If all of the above answers are "NO", then may proceed with Cephalosporin use.     Social History   Tobacco Use  . Smoking status: Current Every Day Smoker    Packs/day: 1.00    Years: 40.00    Pack years: 40.00    Types: Cigarettes  . Smokeless tobacco: Never Used  Substance Use Topics  . Alcohol use: Yes    Comment: social drinker    Family History  Problem Relation Age of Onset  . Cancer Mother   . CAD Neg Hx     Review of Systems As stated in HPI  Objective:   Vitals: Ht: 5 ft 10 in 05/07/2020 10:20 am BP: 132/80 05/07/2020 10:24 am  Clinical exam: Patient is alert and oriented 3. No shortness of breath or chest pain. Heart: Regular rate and rhythm, no rubs, murmurs, or gallops Lungs: +wheezing noted Abdomen: Soft and nontender, no rebound tenderness, no incontinence of bowel and bladder Neck: Ongoing significant neck pain, positive occipital headaches. Limited range of motion due to pain. Neuro: Positive numbness and dysesthesias primarily in the right C6 and C7 dermatome. Positive right Spurling sign. 5/5 motor strength in the upper extremity. Reflexes: Hoffman: Negative, 1+ symmetrical deep tendon reflexes bilaterally upper extremity Gait: Normal Cervical MRI: completed on 11/23/19 was reviewed with the patient. It was completed at [facility] I have independently reviewed the  images as well as the radiology report. No cord signal changes. Neural foramen are widely patent with no significant stenosis at C2-5. C5-6: Moderate sized hard disc osteophyte asymmetrical to the left with moderate foraminal narrowing. C6-7: Moderate sized broad-based disc osteophyte complex moderate to severe right neuroforaminal narrowing with a small right foraminal disc extrusion causing irritation to the exiting C7 nerve  root. Also possible encroachment on the right C7 dorsal root ganglion. No fracture or abnormal marrow signal changes.  Updated MRI on 04/26/2020 shows no significant changes.  Assessment:   Preston Ortiz is a very pleasant 65 year old male S medical history significant for hypertension, neck pain and radicular Right arm pain in the C6 and C7 dermatome pattern. Fortunately, he does not have any motor deficits. Imaging studies do confirm nerve irritation as well as degenerative disc disease at these levels. Patient has failed to improve with conservative treatment measures including injection therapy. And therefore, I do think it is reasonable to move forward with surgical intervention. Plan is to move forward with a 2 level C5-7 ACDF.  Plan:   Risks and benefits of surgery were discussed with the patient. These include: Infection, bleeding, death, stroke, paralysis, ongoing or worse pain, need for additional surgery, nonunion, leak of spinal fluid, adjacent segment degeneration requiring additional fusion surgery. Pseudoarthrosis (nonunion)requiring supplemental posterior fixation. Throat pain, swallowing difficulties, hoarseness or change in voice. With respect to disc replacement: Additional risks include heterotopic ossification, inability to place the disc due to technical issues requiring bailout to a fusion procedure. Patient was recently started on blood pressure medications and we will try to get medical clearance through anesthesia. Patient is not on any blood thinners or aspirin. He is not taking any anti-inflammatory medications. I have advised him not to take any anti-inflammatory medications or aspirin leading up to surgery and he did express understanding of this. Additionally, he will hold his multivitamin for 1 week prior to surgery. We have also discussed the post-operative recovery period to include: bathing/showering restrictions, wound healing, activity (and driving) restrictions,  medications/pain mangement. We have also discussed post-operative redflags to include: signs and symptoms of postoperative infection, DVT/PE. Patient will continue light duty restrictions. All patients questions were invited and answered. Addendum: Patient had a nice communication in his recent preoperative MRI was canceled. I have gone over all of his questions concerning the 2 level ACDF. He is expressed desire to move forward with surgery. We have reviewed the surgical animated video and I have discussed the details of the surgery. Risks and benefits of surgery were discussed with the patient. These include: Infection, bleeding, death, stroke, paralysis, ongoing or worse pain, need for additional surgery, nonunion, leak of spinal fluid, adjacent segment degeneration requiring additional fusion surgery. Pseudoarthrosis (nonunion)requiring supplemental posterior fixation. Throat pain, swallowing difficulties, hoarseness or change in voice. Plan: 1. Stat MRI of his cervical spine to ensure there is been no significant changes from his prior one done in January. If the MRI is similar we will plan on moving forward with a 2 level ACDF as originally discussed. 2. Patient unfortunately does not have a primary care physician nor does he have insurance. His only medical issue is high blood pressure which she is currently on medications managing his blood pressure. In terms of his preop medical clearance we will try and get him into the clinic otherwise we will have anesthesia do a preop evaluation. We will move forward with obtaining a chest x-ray, EKG, and appropriate blood work as part of his  preop workup. 3. No change to his current work restrictions.

## 2020-05-12 NOTE — Pre-Procedure Instructions (Signed)
[facility] - [place], [place] - [address] [place] [address] Phone: [phone] Fax: [phone]    Your procedure is scheduled on Thurs., May 16, 2020 from 11:00AM-2:43PM  Report to [facility] Entr[facility] "A" at 9:00AM  Call this number if you have problems the morning of surgery:  [phone]   Remember:  Do not eat or drink after midnight on July 21st    Take these medicines the morning of surgery with A SIP OF WATER: AmLODipine (NORVASC)   As of today, STOP taking all Aspirin (unless instructed by your doctor) and Other Aspirin containing products, Vitamins, Fish oils, and Herbal medications. Also stop all NSAIDS i.e. Advil, Ibuprofen, Motrin, Aleve, Anaprox, Naproxen, BC, Goody Powders, and all Supplements.  No Smoking of any kind, Tobacco, or Alcohol products 24 hours prior to your procedure. If you use a Cpap at night, you may bring all equipment for your overnight stay.   Special instructions:   [facility]- Preparing For Surgery  Before surgery, you can play an important role. Because skin is not sterile, your skin needs to be as free of germs as possible. You can reduce the number of germs on your skin by washing with CHG (chlorahexidine gluconate) Soap before surgery.  CHG is an antiseptic cleaner which kills germs and bonds with the skin to continue killing germs even after washing.    Please do not use if you have an allergy to CHG or antibacterial soaps. If your skin becomes reddened/irritated stop using the CHG.  Do not shave (including legs and underarms) for at least 48 hours prior to first CHG shower. It is OK to shave your face.  Please follow these instructions carefully.   1. Shower the NIGHT BEFORE SURGERY and the MORNING OF SURGERY with CHG.   2. If you chose to wash your hair, wash your hair first as usual with your normal shampoo.  3. After you shampoo, rinse your hair and body thoroughly to remove the  shampoo.  4. Use CHG as you would any other liquid soap. You can apply CHG directly to the skin and wash gently with a scrungie or a clean washcloth.   5. Apply the CHG Soap to your body ONLY FROM THE NECK DOWN.  Do not use on open wounds or open sores. Avoid contact with your eyes, ears, mouth and genitals (private parts). Wash Face and genitals (private parts)  with your normal soap.  6. Wash thoroughly, paying special attention to the area where your surgery will be performed.  7. Thoroughly rinse your body with warm water from the neck down.  8. DO NOT shower/wash with your normal soap after using and rinsing off the CHG Soap.  9. Pat yourself dry with a CLEAN TOWEL.  10. Wear CLEAN PAJAMAS to bed the night before surgery, wear comfortable clothes the morning of surgery  11. Place CLEAN SHEETS on your bed the night of your first shower and DO NOT SLEEP WITH PETS.   Day of Surgery:            Remember to brush your teeth WITH YOUR REGULAR TOOTHPASTE.  Do not wear jewelry.  Do not wear lotions, powders, colognes, or deodorant.  Do not shave 48 hours prior to surgery.  Men may shave face and neck.  Do not bring valuables to the hospital.  [facility] is not responsible for any belongings or valuables.  Contacts, dentures or bridgework may not be  worn into surgery.   For patients admitted to the hospital, discharge time will be determined by your treatment team.  Patients discharged the day of surgery will not be allowed to drive home, and someone age 24 and over needs to stay with them for 24 hours.  Please wear clean clothes to the hospital/surgery center.    Please read over the following fact sheets that you were given.

## 2020-05-13 ENCOUNTER — Ambulatory Visit: Payer: Self-pay | Admitting: Orthopedic Surgery

## 2020-05-13 ENCOUNTER — Other Ambulatory Visit: Payer: Self-pay

## 2020-05-13 ENCOUNTER — Encounter (HOSPITAL_COMMUNITY): Payer: Self-pay

## 2020-05-13 ENCOUNTER — Other Ambulatory Visit (HOSPITAL_COMMUNITY)
Admission: RE | Admit: 2020-05-13 | Discharge: 2020-05-13 | Disposition: A | Discharge status: Home / Self Care | Payer: No Typology Code available for payment source | Source: Ambulatory Visit | Attending: Orthopedic Surgery | Admitting: Orthopedic Surgery

## 2020-05-13 ENCOUNTER — Ambulatory Visit (HOSPITAL_COMMUNITY)
Admission: RE | Admit: 2020-05-13 | Discharge: 2020-05-13 | Disposition: A | Discharge status: Home / Self Care | Payer: No Typology Code available for payment source | Source: Ambulatory Visit | Attending: Orthopedic Surgery | Admitting: Orthopedic Surgery

## 2020-05-13 ENCOUNTER — Encounter (HOSPITAL_COMMUNITY)
Admission: RE | Admit: 2020-05-13 | Discharge: 2020-05-13 | Disposition: A | Discharge status: Home / Self Care | Payer: No Typology Code available for payment source | Source: Ambulatory Visit | Attending: Orthopedic Surgery | Admitting: Orthopedic Surgery

## 2020-05-13 DIAGNOSIS — Z01818 Encounter for other preprocedural examination: Secondary | ICD-10-CM | POA: Insufficient documentation

## 2020-05-13 DIAGNOSIS — I4510 Unspecified right bundle-branch block: Secondary | ICD-10-CM | POA: Diagnosis not present

## 2020-05-13 DIAGNOSIS — Z01812 Encounter for preprocedural laboratory examination: Secondary | ICD-10-CM | POA: Diagnosis not present

## 2020-05-13 DIAGNOSIS — Z01810 Encounter for preprocedural cardiovascular examination: Secondary | ICD-10-CM | POA: Diagnosis not present

## 2020-05-13 DIAGNOSIS — I491 Atrial premature depolarization: Secondary | ICD-10-CM | POA: Insufficient documentation

## 2020-05-13 DIAGNOSIS — Z20822 Contact with and (suspected) exposure to covid-19: Secondary | ICD-10-CM | POA: Insufficient documentation

## 2020-05-13 DIAGNOSIS — M5412 Radiculopathy, cervical region: Secondary | ICD-10-CM | POA: Insufficient documentation

## 2020-05-13 LAB — CBC
HCT: 45 % (ref 39.0–52.0)
Hemoglobin: 15 g/dL (ref 13.0–17.0)
MCH: 32.1 pg (ref 26.0–34.0)
MCHC: 33.3 g/dL (ref 30.0–36.0)
MCV: 96.4 fL (ref 80.0–100.0)
Platelets: 286 10*3/uL (ref 150–400)
RBC: 4.67 MIL/uL (ref 4.22–5.81)
RDW: 12.9 % (ref 11.5–15.5)
WBC: 11.2 10*3/uL — ABNORMAL HIGH (ref 4.0–10.5)
nRBC: 0 % (ref 0.0–0.2)

## 2020-05-13 LAB — URINALYSIS, ROUTINE W REFLEX MICROSCOPIC
Bilirubin Urine: NEGATIVE
Glucose, UA: NEGATIVE mg/dL
Hgb urine dipstick: NEGATIVE
Ketones, ur: NEGATIVE mg/dL
Leukocytes,Ua: NEGATIVE
Nitrite: NEGATIVE
Protein, ur: NEGATIVE mg/dL
Specific Gravity, Urine: 1.027 (ref 1.005–1.030)
pH: 5 (ref 5.0–8.0)

## 2020-05-13 LAB — TYPE AND SCREEN
ABO/RH(D): O NEG
Antibody Screen: NEGATIVE

## 2020-05-13 LAB — SURGICAL PCR SCREEN
MRSA, PCR: NEGATIVE
Staphylococcus aureus: NEGATIVE

## 2020-05-13 LAB — BASIC METABOLIC PANEL
Anion gap: 10 (ref 5–15)
BUN: 15 mg/dL (ref 8–23)
CO2: 25 mmol/L (ref 22–32)
Calcium: 9.4 mg/dL (ref 8.9–10.3)
Chloride: 104 mmol/L (ref 98–111)
Creatinine, Ser: 0.99 mg/dL (ref 0.61–1.24)
GFR calc Af Amer: >60 mL/min (ref >60)
GFR calc non Af Amer: >60 mL/min (ref >60)
Glucose, Bld: 99 mg/dL (ref 70–99)
Potassium: 4.2 mmol/L (ref 3.5–5.1)
Sodium: 139 mmol/L (ref 135–145)

## 2020-05-13 LAB — PROTIME-INR
INR: 1 (ref 0.8–1.2)
Prothrombin Time: 13.1 seconds (ref 11.4–15.2)

## 2020-05-13 LAB — SARS CORONAVIRUS 2 (TAT 6-24 HRS): SARS Coronavirus 2: NEGATIVE

## 2020-05-13 LAB — APTT: aPTT: 30 seconds (ref 24–36)

## 2020-05-13 NOTE — Progress Notes (Signed)
PCP -denies  Cardiologist -denies   Chest x-ray - 05/13/20-per order EKG - 05/13/20-per order Stress Test - denies ECHO - denies Cardiac Cath - denies  Sleep Study - denies CPAP - denies  Blood Thinner Instructions:N/A Aspirin Instructions:N/A  ERAS Protcol -N/A PRE-SURGERY Ensure or G2- N/A  COVID TEST- Scheduled for today after PAT appointment.    Anesthesia review: Yes, per order.  Patient denies shortness of breath, fever, cough and chest pain at PAT appointment   All instructions explained to the patient, with a verbal understanding of the material. Patient agrees to go over the instructions while at home for a better understanding. Patient also instructed to self quarantine after being tested for COVID-19. The opportunity to ask questions was provided.    Coronavirus Screening  Have you experienced the following symptoms:  Cough yes/no: No Fever (>100.65F)  yes/no: No Runny nose yes/no: No Sore throat yes/no: No Difficulty breathing/shortness of breath  yes/no: No  Have you or a family member traveled in the last 14 days and where? yes/no: No   If the patient indicates "YES" to the above questions, their PAT will be rescheduled to limit the exposure to others and, the surgeon will be notified. THE PATIENT WILL NEED TO BE ASYMPTOMATIC FOR 14 DAYS.   If the patient is not experiencing any of these symptoms, the PAT nurse will instruct them to NOT bring anyone with them to their appointment since they may have these symptoms or traveled as well.   Please remind your patients and families that hospital visitation restrictions are in effect and the importance of the restrictions.
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
## 2020-05-14 NOTE — Anesthesia Preprocedure Evaluation (Addendum)
Anesthesia Evaluation  Patient identified by MRN, date of birth, ID band Patient awake    Reviewed: Allergy & Precautions, NPO status , Patient's Chart, lab work & pertinent test results  History of Anesthesia Complications Negative for: history of anesthetic complications  Airway Mallampati: II  TM Distance: >3 FB Neck ROM: Limited    Dental  (+) Edentulous Upper, Edentulous Lower   Pulmonary Current Smoker and Patient abstained from smoking.,    Pulmonary exam normal        Cardiovascular hypertension, Pt. on medications Normal cardiovascular exam     Neuro/Psych negative psych ROS   GI/Hepatic Neg liver ROS, GERD  Controlled,  Endo/Other   Obesity   Renal/GU negative Renal ROS     Musculoskeletal  (+) Arthritis , Osteoarthritis,    Abdominal (+) + obese,   Peds  Hematology negative hematology ROS (+)   Anesthesia Other Findings Covid test negative   Reproductive/Obstetrics                           Anesthesia Physical Anesthesia Plan  ASA: III  Anesthesia Plan: General   Post-op Pain Management:    Induction: Intravenous  PONV Risk Score and Plan: 3 and Treatment may vary due to age or medical condition, Ondansetron, Dexamethasone and Midazolam  Airway Management Planned: Oral ETT and Video Laryngoscope Planned  Additional Equipment: None  Intra-op Plan:   Post-operative Plan: Extubation in OR  Informed Consent: I have reviewed the patients History and Physical, chart, labs and discussed the procedure including the risks, benefits and alternatives for the proposed anesthesia with the patient or authorized representative who has indicated his/her understanding and acceptance.     Dental advisory given  Plan Discussed with: CRNA and Anesthesiologist  Anesthesia Plan Comments: (See PAT note)      Anesthesia Quick Evaluation

## 2020-05-15 MED ORDER — VANCOMYCIN HCL 1500 MG/300ML IV SOLN
1500.0000 mg | INTRAVENOUS | Status: AC
  Administered 2020-05-16: 1500 mg via INTRAVENOUS
  Filled 2020-05-15: qty 300

## 2020-05-16 ENCOUNTER — Ambulatory Visit (HOSPITAL_COMMUNITY): Payer: No Typology Code available for payment source | Admitting: Certified Registered"

## 2020-05-16 ENCOUNTER — Ambulatory Visit (HOSPITAL_COMMUNITY)
Admission: RE | Disposition: A | Discharge status: Home / Self Care | Payer: Self-pay | Source: Home / Self Care | Attending: Orthopedic Surgery

## 2020-05-16 ENCOUNTER — Other Ambulatory Visit: Payer: Self-pay

## 2020-05-16 ENCOUNTER — Encounter (HOSPITAL_COMMUNITY): Payer: Self-pay | Admitting: Orthopedic Surgery

## 2020-05-16 ENCOUNTER — Observation Stay (HOSPITAL_COMMUNITY)
Admission: RE | Admit: 2020-05-16 | Discharge: 2020-05-17 | Disposition: A | Discharge status: Home / Self Care | Payer: No Typology Code available for payment source | Attending: Orthopedic Surgery | Admitting: Orthopedic Surgery

## 2020-05-16 ENCOUNTER — Ambulatory Visit (HOSPITAL_COMMUNITY): Payer: No Typology Code available for payment source

## 2020-05-16 ENCOUNTER — Ambulatory Visit (HOSPITAL_COMMUNITY): Payer: No Typology Code available for payment source | Admitting: Vascular Surgery

## 2020-05-16 DIAGNOSIS — I10 Essential (primary) hypertension: Secondary | ICD-10-CM | POA: Diagnosis not present

## 2020-05-16 DIAGNOSIS — M5020 Other cervical disc displacement, unspecified cervical region: Secondary | ICD-10-CM | POA: Diagnosis not present

## 2020-05-16 DIAGNOSIS — Z96641 Presence of right artificial hip joint: Secondary | ICD-10-CM | POA: Diagnosis not present

## 2020-05-16 DIAGNOSIS — M79601 Pain in right arm: Secondary | ICD-10-CM | POA: Diagnosis present

## 2020-05-16 DIAGNOSIS — Z419 Encounter for procedure for purposes other than remedying health state, unspecified: Secondary | ICD-10-CM

## 2020-05-16 DIAGNOSIS — F17210 Nicotine dependence, cigarettes, uncomplicated: Secondary | ICD-10-CM | POA: Diagnosis not present

## 2020-05-16 HISTORY — PX: ANTERIOR CERVICAL DECOMP/DISCECTOMY FUSION: SHX1161

## 2020-05-16 HISTORY — PX: HEMATOMA EVACUATION: SHX5118

## 2020-05-16 SURGERY — ANTERIOR CERVICAL DECOMPRESSION/DISCECTOMY FUSION 2 LEVELS
Anesthesia: General | Site: Spine Cervical

## 2020-05-16 MED ORDER — SODIUM CHLORIDE 0.9% FLUSH: 3.0000 mL | INTRAVENOUS | Status: DC | PRN

## 2020-05-16 MED ORDER — FENTANYL CITRATE (PF) 100 MCG/2ML IJ SOLN
INTRAMUSCULAR | Status: AC
  Filled 2020-05-16: qty 2

## 2020-05-16 MED ORDER — LACTATED RINGERS IV SOLN: INTRAVENOUS | Status: DC

## 2020-05-16 MED ORDER — OXYCODONE HCL 5 MG PO TABS: 5.0000 mg | ORAL_TABLET | Freq: Once | ORAL | Status: DC | PRN

## 2020-05-16 MED ORDER — METHOCARBAMOL 500 MG PO TABS
500.0000 mg | ORAL_TABLET | Freq: Four times a day (QID) | ORAL | Status: DC | PRN
  Administered 2020-05-16 – 2020-05-17 (×4): 500 mg via ORAL
  Filled 2020-05-16 (×4): qty 1

## 2020-05-16 MED ORDER — PROPOFOL 10 MG/ML IV BOLUS
INTRAVENOUS | Status: AC
  Filled 2020-05-16: qty 20

## 2020-05-16 MED ORDER — OXYCODONE-ACETAMINOPHEN 10-325 MG PO TABS: 1.0000 | ORAL_TABLET | Freq: Four times a day (QID) | ORAL | 0 refills | Status: AC | PRN

## 2020-05-16 MED ORDER — FENTANYL CITRATE (PF) 100 MCG/2ML IJ SOLN: 25.0000 ug | INTRAMUSCULAR | Status: DC | PRN

## 2020-05-16 MED ORDER — MENTHOL 3 MG MT LOZG: 1.0000 | LOZENGE | OROMUCOSAL | Status: DC | PRN

## 2020-05-16 MED ORDER — FENTANYL CITRATE (PF) 250 MCG/5ML IJ SOLN
INTRAMUSCULAR | Status: DC | PRN
  Administered 2020-05-16 (×5): 50 ug via INTRAVENOUS

## 2020-05-16 MED ORDER — PHENOL 1.4 % MT LIQD: 1.0000 | OROMUCOSAL | Status: DC | PRN

## 2020-05-16 MED ORDER — HYDROMORPHONE HCL 1 MG/ML IJ SOLN
INTRAMUSCULAR | Status: DC | PRN
  Administered 2020-05-16 (×2): .5 mg via INTRAVENOUS

## 2020-05-16 MED ORDER — TRANEXAMIC ACID-NACL 1000-0.7 MG/100ML-% IV SOLN
INTRAVENOUS | Status: AC
  Filled 2020-05-16: qty 100

## 2020-05-16 MED ORDER — ACETAMINOPHEN 650 MG RE SUPP: 650.0000 mg | RECTAL | Status: DC | PRN

## 2020-05-16 MED ORDER — ACETAMINOPHEN 325 MG PO TABS
650.0000 mg | ORAL_TABLET | ORAL | Status: DC | PRN
  Administered 2020-05-16 – 2020-05-17 (×2): 650 mg via ORAL
  Filled 2020-05-16: qty 2

## 2020-05-16 MED ORDER — ORAL CARE MOUTH RINSE: 15.0000 mL | Freq: Once | OROMUCOSAL | Status: AC

## 2020-05-16 MED ORDER — POLYETHYLENE GLYCOL 3350 17 G PO PACK: 17.0000 g | PACK | Freq: Every day | ORAL | Status: DC | PRN

## 2020-05-16 MED ORDER — ONDANSETRON HCL 4 MG/2ML IJ SOLN
INTRAMUSCULAR | Status: DC | PRN
  Administered 2020-05-16: 4 mg via INTRAVENOUS

## 2020-05-16 MED ORDER — LIDOCAINE 2% (20 MG/ML) 5 ML SYRINGE
INTRAMUSCULAR | Status: DC | PRN
  Administered 2020-05-16 (×2): 60 mg via INTRAVENOUS

## 2020-05-16 MED ORDER — BUPIVACAINE HCL (PF) 0.25 % IJ SOLN
INTRAMUSCULAR | Status: AC
  Filled 2020-05-16: qty 30

## 2020-05-16 MED ORDER — MIDAZOLAM HCL 2 MG/2ML IJ SOLN
INTRAMUSCULAR | Status: AC
  Filled 2020-05-16: qty 2

## 2020-05-16 MED ORDER — OXYCODONE HCL 5 MG PO TABS: 5.0000 mg | ORAL_TABLET | ORAL | Status: DC | PRN

## 2020-05-16 MED ORDER — PROPOFOL 10 MG/ML IV BOLUS
INTRAVENOUS | Status: DC | PRN
  Administered 2020-05-16: 100 mg via INTRAVENOUS
  Administered 2020-05-16: 200 mg via INTRAVENOUS

## 2020-05-16 MED ORDER — HYDROMORPHONE HCL 1 MG/ML IJ SOLN
INTRAMUSCULAR | Status: AC
  Filled 2020-05-16: qty 0.5

## 2020-05-16 MED ORDER — EPINEPHRINE PF 1 MG/ML IJ SOLN
INTRAMUSCULAR | Status: AC
  Filled 2020-05-16: qty 1

## 2020-05-16 MED ORDER — DEXAMETHASONE SODIUM PHOSPHATE 10 MG/ML IJ SOLN
INTRAMUSCULAR | Status: DC | PRN
  Administered 2020-05-16: 4 mg via INTRAVENOUS

## 2020-05-16 MED ORDER — THROMBIN (RECOMBINANT) 20000 UNITS EX SOLR
CUTANEOUS | Status: AC
  Filled 2020-05-16: qty 20000

## 2020-05-16 MED ORDER — ONDANSETRON HCL 4 MG/2ML IJ SOLN: 4.0000 mg | Freq: Four times a day (QID) | INTRAMUSCULAR | Status: DC | PRN

## 2020-05-16 MED ORDER — METHOCARBAMOL 1000 MG/10ML IJ SOLN
500.0000 mg | Freq: Four times a day (QID) | INTRAVENOUS | Status: DC | PRN
  Filled 2020-05-16: qty 5

## 2020-05-16 MED ORDER — ROCURONIUM BROMIDE 10 MG/ML (PF) SYRINGE
PREFILLED_SYRINGE | INTRAVENOUS | Status: DC | PRN
  Administered 2020-05-16: 60 mg via INTRAVENOUS
  Administered 2020-05-16 (×3): 20 mg via INTRAVENOUS

## 2020-05-16 MED ORDER — THROMBIN 20000 UNITS EX SOLR
CUTANEOUS | Status: DC | PRN
  Administered 2020-05-16: 20 mL via TOPICAL

## 2020-05-16 MED ORDER — AMLODIPINE BESYLATE 5 MG PO TABS
10.0000 mg | ORAL_TABLET | Freq: Every day | ORAL | Status: DC
  Administered 2020-05-17: 10 mg via ORAL
  Filled 2020-05-16: qty 2

## 2020-05-16 MED ORDER — CHLORHEXIDINE GLUCONATE 0.12 % MT SOLN: 15.0000 mL | Freq: Once | OROMUCOSAL | Status: AC

## 2020-05-16 MED ORDER — METHOCARBAMOL 500 MG PO TABS: 500.0000 mg | ORAL_TABLET | Freq: Three times a day (TID) | ORAL | 0 refills | Status: AC | PRN

## 2020-05-16 MED ORDER — DOCUSATE SODIUM 100 MG PO CAPS
100.0000 mg | ORAL_CAPSULE | Freq: Two times a day (BID) | ORAL | Status: DC
  Administered 2020-05-16 – 2020-05-17 (×2): 100 mg via ORAL
  Filled 2020-05-16 (×2): qty 1

## 2020-05-16 MED ORDER — TRANEXAMIC ACID-NACL 1000-0.7 MG/100ML-% IV SOLN
INTRAVENOUS | Status: DC | PRN
  Administered 2020-05-16: 1000 mg via INTRAVENOUS

## 2020-05-16 MED ORDER — 0.9 % SODIUM CHLORIDE (POUR BTL) OPTIME
TOPICAL | Status: DC | PRN
  Administered 2020-05-16: 1000 mL
  Administered 2020-05-16: 2000 mL

## 2020-05-16 MED ORDER — MIDAZOLAM HCL 5 MG/5ML IJ SOLN
INTRAMUSCULAR | Status: DC | PRN
  Administered 2020-05-16: 2 mg via INTRAVENOUS

## 2020-05-16 MED ORDER — PHENYLEPHRINE 40 MCG/ML (10ML) SYRINGE FOR IV PUSH (FOR BLOOD PRESSURE SUPPORT)
PREFILLED_SYRINGE | INTRAVENOUS | Status: AC
  Filled 2020-05-16: qty 10

## 2020-05-16 MED ORDER — LIDOCAINE 2% (20 MG/ML) 5 ML SYRINGE
INTRAMUSCULAR | Status: AC
  Filled 2020-05-16: qty 5

## 2020-05-16 MED ORDER — BUPIVACAINE-EPINEPHRINE 0.25% -1:200000 IJ SOLN
INTRAMUSCULAR | Status: DC | PRN
  Administered 2020-05-16: 8 mL

## 2020-05-16 MED ORDER — LACTATED RINGERS IV SOLN: INTRAVENOUS | Status: DC | PRN

## 2020-05-16 MED ORDER — VANCOMYCIN HCL IN DEXTROSE 1-5 GM/200ML-% IV SOLN
1000.0000 mg | Freq: Two times a day (BID) | INTRAVENOUS | Status: DC
  Administered 2020-05-16: 1000 mg via INTRAVENOUS
  Filled 2020-05-16: qty 200

## 2020-05-16 MED ORDER — SUGAMMADEX SODIUM 200 MG/2ML IV SOLN
INTRAVENOUS | Status: DC | PRN
  Administered 2020-05-16: 240 mg via INTRAVENOUS

## 2020-05-16 MED ORDER — ONDANSETRON HCL 4 MG PO TABS: 4.0000 mg | ORAL_TABLET | Freq: Three times a day (TID) | ORAL | 0 refills | Status: DC | PRN

## 2020-05-16 MED ORDER — DEXAMETHASONE SODIUM PHOSPHATE 10 MG/ML IJ SOLN
INTRAMUSCULAR | Status: AC
  Filled 2020-05-16: qty 1

## 2020-05-16 MED ORDER — ONDANSETRON HCL 4 MG PO TABS: 4.0000 mg | ORAL_TABLET | Freq: Four times a day (QID) | ORAL | Status: DC | PRN

## 2020-05-16 MED ORDER — PHENYLEPHRINE 40 MCG/ML (10ML) SYRINGE FOR IV PUSH (FOR BLOOD PRESSURE SUPPORT)
PREFILLED_SYRINGE | INTRAVENOUS | Status: DC | PRN
  Administered 2020-05-16 (×2): 80 ug via INTRAVENOUS

## 2020-05-16 MED ORDER — OXYCODONE HCL 5 MG/5ML PO SOLN: 5.0000 mg | Freq: Once | ORAL | Status: DC | PRN

## 2020-05-16 MED ORDER — OXYCODONE HCL 5 MG PO TABS
10.0000 mg | ORAL_TABLET | ORAL | Status: DC | PRN
  Administered 2020-05-16 – 2020-05-17 (×7): 10 mg via ORAL
  Filled 2020-05-16 (×7): qty 2

## 2020-05-16 MED ORDER — PROMETHAZINE HCL 25 MG/ML IJ SOLN: 6.2500 mg | INTRAMUSCULAR | Status: DC | PRN

## 2020-05-16 MED ORDER — HEMOSTATIC AGENTS (NO CHARGE) OPTIME
TOPICAL | Status: DC | PRN
  Administered 2020-05-16: 1 via TOPICAL

## 2020-05-16 MED ORDER — FENTANYL CITRATE (PF) 250 MCG/5ML IJ SOLN
INTRAMUSCULAR | Status: AC
  Filled 2020-05-16: qty 5

## 2020-05-16 MED ORDER — CHLORHEXIDINE GLUCONATE 0.12 % MT SOLN
OROMUCOSAL | Status: AC
  Administered 2020-05-16: 15 mL via OROMUCOSAL
  Filled 2020-05-16: qty 15

## 2020-05-16 MED ORDER — MORPHINE SULFATE (PF) 2 MG/ML IV SOLN
2.0000 mg | INTRAVENOUS | Status: AC | PRN
  Administered 2020-05-16: 2 mg via INTRAVENOUS
  Filled 2020-05-16: qty 1

## 2020-05-16 MED ORDER — ROCURONIUM BROMIDE 10 MG/ML (PF) SYRINGE
PREFILLED_SYRINGE | INTRAVENOUS | Status: AC
  Filled 2020-05-16: qty 10

## 2020-05-16 MED ORDER — SODIUM CHLORIDE 0.9% FLUSH
3.0000 mL | Freq: Two times a day (BID) | INTRAVENOUS | Status: DC
  Administered 2020-05-16: 3 mL via INTRAVENOUS

## 2020-05-16 MED ORDER — ONDANSETRON HCL 4 MG/2ML IJ SOLN
INTRAMUSCULAR | Status: AC
  Filled 2020-05-16: qty 2

## 2020-05-16 MED ORDER — SODIUM CHLORIDE 0.9 % IV SOLN: 250.0000 mL | INTRAVENOUS | Status: DC

## 2020-05-16 MED ORDER — EPINEPHRINE PF 1 MG/ML IJ SOLN
INTRAMUSCULAR | Status: DC | PRN
  Administered 2020-05-16: 1 mg

## 2020-05-16 SURGICAL SUPPLY — 78 items
BAND RUBBER #18 3X1/16 STRL (MISCELLANEOUS) IMPLANT
BIT DRILL NEURO 2X3.1 SFT TUCH (MISCELLANEOUS) IMPLANT
BLADE CLIPPER SURG (BLADE) IMPLANT
BONE MATRIX OSTEOCEL PRO MED (Bone Implant) ×3 IMPLANT
BUR EGG ELITE 4.0 (BURR) IMPLANT
BUR EGG ELITE 4.0MM (BURR)
BUR MATCHSTICK NEURO 3.0 LAGG (BURR) IMPLANT
CABLE BIPOLOR RESECTION CORD (MISCELLANEOUS) ×3 IMPLANT
CAGE CERV MOD 8X17X14 7D (Cage) ×6 IMPLANT
CANISTER SUCT 3000ML PPV (MISCELLANEOUS) ×3 IMPLANT
CLOSURE STERI-STRIP 1/2X4 (GAUZE/BANDAGES/DRESSINGS) ×1
CLSR STERI-STRIP ANTIMIC 1/2X4 (GAUZE/BANDAGES/DRESSINGS) ×2 IMPLANT
CORD BIPOLAR FORCEPS 12FT (ELECTRODE) ×3 IMPLANT
COVER MAYO STAND STRL (DRAPES) ×9 IMPLANT
COVER SURGICAL LIGHT HANDLE (MISCELLANEOUS) ×6 IMPLANT
COVER WAND RF STERILE (DRAPES) ×3 IMPLANT
DRAIN TLS ROUND 10FR (DRAIN) ×3 IMPLANT
DRAPE C-ARM 42X72 X-RAY (DRAPES) ×3 IMPLANT
DRAPE MICROSCOPE LEICA 46X105 (MISCELLANEOUS) IMPLANT
DRAPE POUCH INSTRU U-SHP 10X18 (DRAPES) ×3 IMPLANT
DRAPE SURG 17X23 STRL (DRAPES) ×3 IMPLANT
DRAPE U-SHAPE 47X51 STRL (DRAPES) ×3 IMPLANT
DRILL NEURO 2X3.1 SOFT TOUCH (MISCELLANEOUS)
DRSG OPSITE POSTOP 3X4 (GAUZE/BANDAGES/DRESSINGS) ×3 IMPLANT
DRSG OPSITE POSTOP 4X6 (GAUZE/BANDAGES/DRESSINGS) ×3 IMPLANT
DURAPREP 26ML APPLICATOR (WOUND CARE) ×3 IMPLANT
ELECT COATED BLADE 2.86 ST (ELECTRODE) ×3 IMPLANT
ELECT PENCIL ROCKER SW 15FT (MISCELLANEOUS) ×3 IMPLANT
ELECT REM PT RETURN 9FT ADLT (ELECTROSURGICAL) ×3
ELECTRODE REM PT RTRN 9FT ADLT (ELECTROSURGICAL) ×1 IMPLANT
GLOVE BIO SURGEON STRL SZ 6.5 (GLOVE) ×2 IMPLANT
GLOVE BIO SURGEONS STRL SZ 6.5 (GLOVE) ×1
GLOVE BIOGEL PI IND STRL 6.5 (GLOVE) ×1 IMPLANT
GLOVE BIOGEL PI IND STRL 8.5 (GLOVE) ×1 IMPLANT
GLOVE BIOGEL PI INDICATOR 6.5 (GLOVE) ×2
GLOVE BIOGEL PI INDICATOR 8.5 (GLOVE) ×2
GLOVE SS BIOGEL STRL SZ 8.5 (GLOVE) ×1 IMPLANT
GLOVE SUPERSENSE BIOGEL SZ 8.5 (GLOVE) ×2
GOWN STRL REUS W/ TWL LRG LVL3 (GOWN DISPOSABLE) ×1 IMPLANT
GOWN STRL REUS W/TWL 2XL LVL3 (GOWN DISPOSABLE) ×3 IMPLANT
GOWN STRL REUS W/TWL LRG LVL3 (GOWN DISPOSABLE) ×2
KIT BASIN OR (CUSTOM PROCEDURE TRAY) ×3 IMPLANT
KIT TURNOVER KIT B (KITS) ×3 IMPLANT
NEEDLE HYPO 22GX1.5 SAFETY (NEEDLE) ×3 IMPLANT
NEEDLE SPNL 18GX3.5 QUINCKE PK (NEEDLE) ×3 IMPLANT
NS IRRIG 1000ML POUR BTL (IV SOLUTION) ×3 IMPLANT
PACK ORTHO CERVICAL (CUSTOM PROCEDURE TRAY) ×3 IMPLANT
PACK UNIVERSAL I (CUSTOM PROCEDURE TRAY) ×3 IMPLANT
PAD ARMBOARD 7.5X6 YLW CONV (MISCELLANEOUS) ×6 IMPLANT
PATTIES SURGICAL .25X.25 (GAUZE/BANDAGES/DRESSINGS) ×3 IMPLANT
PIN DISTRACTION MAXCESS-C 14 ×6 IMPLANT
PLATE ACP 1.6X40 2LVL (Plate) ×3 IMPLANT
POSITIONER HEAD DONUT 9IN (MISCELLANEOUS) ×3 IMPLANT
RESTRAINT LIMB HOLDER UNIV (RESTRAINTS) ×3 IMPLANT
SCREW ACP 3.5 X 13 S/D VARIA (Screw) ×6 IMPLANT
SCREW ACP 3.5X13 S/D VAR ANGLE (Screw) ×2 IMPLANT
SCREW ACP 3.5X17 S/D VARIA (Screw) ×6 IMPLANT
SCREW ACP VA SD 3.5X15 (Screw) ×6 IMPLANT
SPOGE SURGIFLO 8M (HEMOSTASIS) ×2
SPONGE INTESTINAL PEANUT (DISPOSABLE) ×3 IMPLANT
SPONGE LAP 4X18 RFD (DISPOSABLE) ×6 IMPLANT
SPONGE SURGIFLO 8M (HEMOSTASIS) ×1 IMPLANT
SPONGE SURGIFOAM ABS GEL 100 (HEMOSTASIS) ×6 IMPLANT
SURGIFLO W/THROMBIN 8M KIT (HEMOSTASIS) IMPLANT
SUT BONE WAX W31G (SUTURE) ×3 IMPLANT
SUT ETHILON 2 0 FS 18 (SUTURE) ×3 IMPLANT
SUT ETHILON 3 0 PS 1 (SUTURE) ×3 IMPLANT
SUT MNCRL AB 3-0 PS2 27 (SUTURE) ×3 IMPLANT
SUT SILK 2 0 (SUTURE)
SUT SILK 2-0 18XBRD TIE 12 (SUTURE) IMPLANT
SUT VIC AB 2-0 CT1 18 (SUTURE) ×3 IMPLANT
SYR BULB IRRIG 60ML STRL (SYRINGE) ×3 IMPLANT
SYR CONTROL 10ML LL (SYRINGE) ×3 IMPLANT
TAPE CLOTH 4X10 WHT NS (GAUZE/BANDAGES/DRESSINGS) ×3 IMPLANT
TAPE UMBILICAL COTTON 1/8X30 (MISCELLANEOUS) ×3 IMPLANT
TOWEL GREEN STERILE (TOWEL DISPOSABLE) ×3 IMPLANT
TOWEL GREEN STERILE FF (TOWEL DISPOSABLE) ×3 IMPLANT
WATER STERILE IRR 1000ML POUR (IV SOLUTION) ×3 IMPLANT

## 2020-05-16 NOTE — Anesthesia Postprocedure Evaluation (Signed)
Anesthesia Post Note  Patient: Preston Ortiz  Procedure(s) Performed: ANTERIOR CERVICAL DECOMPRESSION/DISCECTOMY FUSION CERVICAL FIVE THROUGH CERVICAL SEVEN (N/A Spine Cervical) EVACUATION HEMATOMA (N/A Spine Cervical)     Patient location during evaluation: PACU Anesthesia Type: General Level of consciousness: awake and alert, patient cooperative and oriented Pain management: pain level controlled Vital Signs Assessment: post-procedure vital signs reviewed and stable Respiratory status: spontaneous breathing, nonlabored ventilation and respiratory function stable Cardiovascular status: blood pressure returned to baseline and stable Postop Assessment: no apparent nausea or vomiting Anesthetic complications: no   No complications documented.  Last Vitals:  Vitals:   05/16/20 1615 05/16/20 1630  BP: (!) 147/83 (!) 145/87  Pulse: 71 72  Resp: (!) 25 20  Temp:  36.5 C  SpO2: 96% 95%    Last Pain:  Vitals:   05/16/20 1630  TempSrc:   PainSc: 0-No pain                 Mazie Fencl,E. Binnie Droessler

## 2020-05-16 NOTE — H&P (Signed)
Addendum H&P  Patient continues to have significant neck and radicular arm pain.  Planning on moving forward with the 2 level ACDF C5-7.  All appropriate risks benefits and alternatives to surgery were discussed with the patient and consent was obtained.  There is been no change in his clinical exam since his last office visit of 05/07/2020

## 2020-05-16 NOTE — Op Note (Signed)
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  As a result I elected to open the wound again identified the bleeder and place a drain.   First assistant: Glynis Smiles  EBL: 50  Implants: NuVasive modulus-C implant.  8 mm cage x2.  NuVasive ACP cervical plate.  40 mm length.  17 mm locking screws into the body of C5, 13 mm locking screws into the body of C6, and 15 mm locking screws into the body of C7.  Allograft: Osteocell  Indications: Preston Ortiz is a very pleasant 64 year old gentleman who presents with significant neck and radicular arm pain after work-related injury.  Despite attempts at conservative management his quality of life is continued to deteriorate.  As a result we have elected to move forward with surgery.  All appropriate risks benefits and alternatives were discussed with the patient and consent was obtained.  Operative report: Patient was brought the operating room placed upon the operating room table.  After successful induction of general anesthesia and endotracheal intubation teds, SCDs were applied.  The anterior cervical spine was then prepped and draped in a standard fashion.  Timeout was taken to confirm patient procedure and all other important pertinent data.  Fluoroscopy was used to identify the C6 vertebral body and I marked out the transverse incision on the skin.  The incision site was infiltrated with quarter percent Marcaine with epinephrine and a transverse incision was made sharp dissection was carried out down to and through the platysma.  I sharply dissected along the medial border the sternocleidomastoid performing a standard Smith-Robinson approach to the cervical spine.  The omohyoid muscle was  identified and sacrificed for better visualization.  I continued to dissect sharply through the deep cervical and prevertebral fascia and then ultimately I used my finger to mobilize the trachea and esophagus to the right identify and protect the carotid sheath on the left.  Kitner dissectors were used to completely expose the anterior cervical spine from C5-6 disc space and an x-ray was taken confirming our level.  Using bipolar cautery I mobilized the longus coli muscl from the superior aspect of C5 to the inferior aspect of C7.  The large anterior osteophytes at the 5 6 and 6 7 disc space level were removed with a double-action Leksell rongeur.  Self-retaining Caspar retractor blades were placed underneath the longus coli muscle and the endotracheal cuff was deflated.  I expanded the retractor to the appropriate width and then reinflated the endotracheal cuff.  Annulotomy was performed at C6-7 with a 15 blade scalpel.  At this point I remove the bulk of the disc material with pituitary rondure.  Using curettes I removed the remaining cartilaginous disc material to expose the bleeding subchondral bone.  Distraction pins were placed into the body of C6 and C7 and I distracted the intervertebral space and maintained with the distraction pin set.  I continued using my curettes to remove all of the disc material and expose the posterior annulus.  The nerve hook was used to dissect through the posterior annulus and posterior longitudinal ligament and create a plane between the thecal sac and the PLL.  I 1 mm Kerrison rongeur was then used to resect the PLL and posterior annulus to allow adequate decompression.  I then undercut the uncovertebral joints to further decompress  the foramen.  At this point I can easily pass my nerve hook under the uncovertebral joints and the vertebral body confirming satisfactory decompression.  The endplates were rasped to ensure I had bleeding subchondral bone and then I used the  trial intervertebral spacers.  The 8 mm implant was packed with the allograft and malleted to the appropriate depth.  The distraction pin was removed from the body of C7 and repositioned into the body of C5.  The resulting bleeding bone was sealed with bone wax.  Using the same technique that I used at C6-7 I performed a discectomy at C5-6.  Again the posterior annulus was taken down as was the posterior longitudinal ligament.  A fine nerve hook was used to create the plane between the PLL and the thecal sac to allow the 1 mm Kerrison to remove the PLL.  Just as at the other level of the osteophyte at the uncovertebral joint was taken down with a 1 mm Kerrison rongeur.  At this point I then placed the intervertebral cage at the allograft.  Both cages were well seated.  Imaging studies were satisfactory.  The anterior cervical plate was then contoured and affixed to the vertebral bodies with the appropriate locking screws.  All 6 locking screws had excellent purchase.  The screws were then locked to the plate according manufacture standards.  At this point I irrigated the wound copiously with normal saline and made sure that hemostasis using bipolar cautery and FloSeal.  The trachea and esophagus were returned to midline and I closed the platysma with interrupted 2-0 Vicryl suture and the skin with a 3-0 Monocryl.  Steri-Strips and a dry dressing were applied.  It was at this point that the patient was beginning to be awoken from anesthesia and began coughing quite aggressively.  Significant bleeding was noted under the drain and so pressure was held.  At this point the drapes had already been removed.  The patient remained intubated and was sedated to prevent further coughing.  Upon inspection I felt as though reopening the wound and placing a drain in identifying the bleeder would be in the patient's best interest.  The patient is a large gentleman with hypertension and my concern was if he had pain or a  hypotensive episode that he could begin bleeding again on the floor which could lead to a hematoma formation and breathing complications.  As result the wound was prepped and draped in a standard fashion.  Timeout was taken confirming the patient and the procedure.  The Monocryl suture was removed and the wound was reopened.  I remove the sutures that were holding the platysma together and I bluntly dissected down to the prevertebral fascia there was no active bleeding that I could appreciate.  I copiously irrigated the wound with normal saline and then inspected and found some small bleeders along the surface of the longus coli muscle.  They were all coagulated.  After irrigating again I confirmed that the irrigation fluid coming out was clear and there was no evidence of any active bleeding.  I did provide the patient with a gram of IV  TXA to aid in postoperative hemostasis.  A drain was placed along the anterior surface of the cervical spine and brought out through a separate stab incision.  The platysma was then reapproximated with 2-0 Vicryl and the skin reapproximated with 3-0 Monocryl.  The drain was also sutured into prevent inadvertent removal.  Dry dressings were applied as was a  collar and the patient was extubated and transferred to the PACU without incident.  At the end of the both cases all needle sponge counts were correct.

## 2020-05-16 NOTE — Progress Notes (Signed)
Pharmacy Antibiotic Note  Preston Ortiz is a 64 y.o. male admitted on 05/16/2020 with surgical prophylaxis.  Pharmacy has been consulted for vanc dosing.  Pt is s/p cervical decompression/discectomy. He does have a drain in place, therefore, we will cont his prophylaxis vanc .   Scr ~1 Preop vanc 1.5g @0926   Plan: Vanc 1g IV q12 F/u drain removal for dc abx  Height: 5\' 10"  (177.8 cm) Weight: 117.7 kg (259 lb 6 oz) IBW/kg (Calculated) : 73  Temp (24hrs), Avg:97.7 F (36.5 C), Min:97.3 F (36.3 C), Max:98.1 F (36.7 C)  Recent Labs  Lab 05/13/20 1344  WBC 11.2*  CREATININE 0.99    Estimated Creatinine Clearance: 99.5 mL/min (by C-G formula based on SCr of 0.99 mg/dL).    Allergies  Allergen Reactions   Cyclobenzaprine     Blurred vision    Gabapentin     Bloody stools    Penicillins     childhood allergy  Has patient had a PCN reaction causing immediate rash, facial/tongue/throat swelling, SOB or lightheadedness with hypotension: Unknown Has patient had a PCN reaction causing severe rash involving mucus membranes or skin necrosis: Unknown Has patient had a PCN reaction that required hospitalization Unknown Has patient had a PCN reaction occurring within the last 10 years: No If all of the above answers are "NO", then may proceed with Cephalosporin use.     Antimicrobials this admission: 7/22 vanc>>  05/15/20, PharmD, [place], AAHIVP, CPP Infectious Disease Pharmacist 05/16/2020 5:06 PM

## 2020-05-16 NOTE — Discharge Instructions (Signed)

## 2020-05-16 NOTE — Anesthesia Procedure Notes (Addendum)
Procedure Name: Intubation Date/Time: 05/16/2020 10:59 AM Performed by: Colon Flattery, CRNA Pre-anesthesia Checklist: Patient identified, Emergency Drugs available, Suction available and Patient being monitored Patient Re-evaluated:Patient Re-evaluated prior to induction Oxygen Delivery Method: Circle system utilized Preoxygenation: Pre-oxygenation with 100% oxygen Induction Type: IV induction Ventilation: Oral airway inserted - appropriate to patient size and Mask ventilation without difficulty Laryngoscope Size: Glidescope and 4 Grade View: Grade I Tube type: Oral Tube size: 7.5 mm Number of attempts: 1 Airway Equipment and Method: Stylet and Video-laryngoscopy Placement Confirmation: ETT inserted through vocal cords under direct vision,  positive ETCO2 and breath sounds checked- equal and bilateral Secured at: 23 cm Tube secured with: Tape Dental Injury: Teeth and Oropharynx as per pre-operative assessment  Comments: Intubated by Ernestina Penna, paramedic student

## 2020-05-16 NOTE — Transfer of Care (Signed)
Immediate Anesthesia Transfer of Care Note  Patient: Preston Ortiz  Procedure(s) Performed: ANTERIOR CERVICAL DECOMPRESSION/DISCECTOMY FUSION CERVICAL FIVE THROUGH CERVICAL SEVEN (N/A Spine Cervical) EVACUATION HEMATOMA (N/A Spine Cervical)  Patient Location: PACU  Anesthesia Type:General  Level of Consciousness: awake, alert  and oriented  Airway & Oxygen Therapy: Patient Spontanous Breathing and Patient connected to face mask oxygen  Post-op Assessment: Report given to RN, Post -op Vital signs reviewed and stable and Patient moving all extremities  Post vital signs: Reviewed and stable  Last Vitals:  Vitals Value Taken Time  BP 156/91 05/16/20 1410  Temp    Pulse 82 05/16/20 1415  Resp 17 05/16/20 1415  SpO2 95 % 05/16/20 1415  Vitals shown include unvalidated device data.  Last Pain:  Vitals:   05/16/20 0930  TempSrc:   PainSc: 3       Patients Stated Pain Goal: 3 (05/16/20 0930)  Complications: No complications documented.

## 2020-05-16 NOTE — Brief Op Note (Signed)
05/16/2020  1:50 PM  PATIENT:  Preston Ortiz  64 y.o. male  PRE-OPERATIVE DIAGNOSIS:  Cervical spondylotic radiculopathy  POST-OPERATIVE DIAGNOSIS:  Cervical spondylotic radiculopathy  PROCEDURE:  Procedure(s) with comments: ANTERIOR CERVICAL DECOMPRESSION/DISCECTOMY FUSION CERVICAL FIVE THROUGH CERVICAL SEVEN (N/A) - 3.5 hrs Needs pre op anesthesia consult-doesn't have a PCP EVACUATION HEMATOMA (N/A)  SURGEON:  Surgeon(s) and Role:    Venita Lick, MD - Primary  PHYSICIAN ASSISTANT:   ASSISTANTS: Amanda Ward, PA  ANESTHESIA:   general  EBL:  50   BLOOD ADMINISTERED:none  DRAINS: 1 anterior cervical drain   LOCAL MEDICATIONS USED:  MARCAINE     SPECIMEN:  No Specimen  DISPOSITION OF SPECIMEN:  N/A  COUNTS:  YES  TOURNIQUET:  * No tourniquets in log *  DICTATION: .Dragon Dictation  PLAN OF CARE: Admit for overnight observation  PATIENT DISPOSITION:  PACU - hemodynamically stable.

## 2020-05-17 MED FILL — Thrombin (Recombinant) For Soln 20000 Unit: CUTANEOUS | Qty: 1 | Status: AC

## 2020-05-17 NOTE — Evaluation (Signed)
Physical Therapy Evaluation Patient Details Name: Preston Ortiz MRN: [id number] DOB: 30-Jun-1956 Today's Date: 05/17/2020   History of Present Illness  Pt is a 64 year old man admitted for C5-C7 ACDF. PMH: smoker, HTN, OA, obesity, THA.  Clinical Impression  Patient evaluated by Physical Therapy with no further acute PT needs identified. All education has been completed and the patient has no further questions. Pt was able to demonstrate transfers and ambulation with gross modified independence to supervision for safety without an AD. Pt was educated on precautions, brace application/wearing schedule, appropriate activity progression, and car transfer. See below for any follow-up Physical Therapy or equipment needs. PT is signing off. Thank you for this referral.     Follow Up Recommendations No PT follow up;Supervision for mobility/OOB    Equipment Recommendations  None recommended by PT    Recommendations for Other Services       Precautions / Restrictions Precautions Precautions: Cervical Precaution Booklet Issued: Yes (comment) Required Braces or Orthoses: Cervical Brace Cervical Brace: Hard collar Restrictions Weight Bearing Restrictions: No      Mobility  Bed Mobility Overal bed mobility: Modified Independent             General bed mobility comments: HOB up, pt has an adjustable bed at home  Transfers Overall transfer level: Modified independent Equipment used: None             General transfer comment: VC's for maintenance of upright posture during rise to stand.   Ambulation/Gait Ambulation/Gait assistance: Supervision Gait Distance (Feet): 400 Feet Assistive device: None Gait Pattern/deviations: Step-through pattern;Decreased stride length;Trunk flexed Gait velocity: Decreased Gait velocity interpretation: 1.31 - 2.62 ft/sec, indicative of limited community ambulator General Gait Details: VC's for improved posture and maintenance of  precautions. Occasionally appears unsteady but able to recover without assist.   Stairs Stairs: Yes Stairs assistance: Supervision Stair Management: One rail Left;Alternating pattern;Forwards Number of Stairs: 5 General stair comments: VC's for general safety awareness.   Wheelchair Mobility    Modified Rankin (Stroke Patients Only)       Balance Overall balance assessment: Mild deficits observed, not formally tested                                           Pertinent Vitals/Pain Pain Assessment: Faces Faces Pain Scale: Hurts a little bit Pain Location: incision Pain Descriptors / Indicators: Sore Pain Intervention(s): Limited activity within patient's tolerance;Monitored during session;Repositioned    Home Living Family/patient expects to be discharged to:: Private residence Living Arrangements: Spouse/significant other (girlfriend) Available Help at Discharge: Family;Available 24 hours/day (Brother also in town until Sunday) Type of Home: House Home Access: Stairs to enter Entrance Stairs-Rails: Right Entrance Stairs-Number of Steps: 3 Home Layout: One level Home Equipment: Grab bars - toilet;Adaptive equipment      Prior Function Level of Independence: Independent         Comments: unable to work     Hand Dominance   Dominant Hand: Right    Extremity/Trunk Assessment   Upper Extremity Assessment Upper Extremity Assessment: RUE deficits/detail RUE Deficits / Details: Pt reports mild numbness in RUE but states it is better than prior to surgery    Lower Extremity Assessment Lower Extremity Assessment: Overall WFL for tasks assessed    Cervical / Trunk Assessment Cervical / Trunk Assessment: Other exceptions Cervical / Trunk Exceptions: s/p surgery  Communication  Communication: No difficulties  Cognition Arousal/Alertness: Awake/alert Behavior During Therapy: WFL for tasks assessed/performed Overall Cognitive Status: Within  Functional Limits for tasks assessed                                        General Comments      Exercises     Assessment/Plan    PT Assessment Patent does not need any further PT services  PT Problem List         PT Treatment Interventions      PT Goals (Current goals can be found in the Care Plan section)  Acute Rehab PT Goals Patient Stated Goal: return home today PT Goal Formulation: All assessment and education complete, DC therapy    Frequency     Barriers to discharge        Co-evaluation               AM-PAC PT "6 Clicks" Mobility  Outcome Measure Help needed turning from your back to your side while in a flat bed without using bedrails?: None Help needed moving from lying on your back to sitting on the side of a flat bed without using bedrails?: None Help needed moving to and from a bed to a chair (including a wheelchair)?: None Help needed standing up from a chair using your arms (e.g., wheelchair or bedside chair)?: None Help needed to walk in hospital room?: None Help needed climbing 3-5 steps with a railing? : None 6 Click Score: 24    End of Session Equipment Utilized During Treatment: Gait belt;Cervical collar Activity Tolerance: Patient tolerated treatment well Patient left: in bed;with call bell/phone within reach;with family/visitor present Nurse Communication: Mobility status PT Visit Diagnosis: Unsteadiness on feet (R26.81);Pain Pain - part of body:  (neck/incision site)    Time: 7824-2353 PT Time Calculation (min) (ACUTE ONLY): 16 min   Charges:   PT Evaluation $PT Eval Low Complexity: 1 Low          Conni Slipper, PT, DPT Acute Rehabilitation Services Pager: [phone] Office: [phone]   Marylynn Pearson 05/17/2020, 12:49 PM

## 2020-05-17 NOTE — Progress Notes (Signed)
    Subjective: Procedure(s) (LRB): ANTERIOR CERVICAL DECOMPRESSION/DISCECTOMY FUSION CERVICAL FIVE THROUGH CERVICAL SEVEN (N/A) EVACUATION HEMATOMA (N/A) 1 Day Post-Op  Patient reports pain as 1 on 0-10 scale.  Reports decreased arm pain reports incisional neck pain   Positive void Negative bowel movement Positive flatus Negative chest pain or shortness of breath  Objective: Vital signs in last 24 hours: Temp:  [97.3 F (36.3 C)-98.4 F (36.9 C)] 97.8 F (36.6 C) (07/23 0359) Pulse Rate:  [69-90] 71 (07/23 0359) Resp:  [14-30] 20 (07/23 0359) BP: (142-164)/(69-97) 149/80 (07/23 0359) SpO2:  [92 %-99 %] 95 % (07/23 0359) Weight:  [117.7 kg] 117.7 kg (07/22 0900)  Intake/Output from previous day: 07/22 0701 - 07/23 0700 In: 1620 [P.O.:120; I.V.:1400; IV Piggyback:100] Out: 208 [Urine:100; Drains:58; Blood:50]  Labs: No results for input(s): WBC, RBC, HCT, PLT in the last 72 hours. No results for input(s): NA, K, CL, CO2, BUN, CREATININE, GLUCOSE, CALCIUM in the last 72 hours. No results for input(s): LABPT, INR in the last 72 hours.  Physical Exam: Neurologically intact ABD soft Intact pulses distally Incision: dressing C/D/I Compartment soft drain output: 58 since surgery Body mass index is 37.22 kg/m.  Assessment/Plan: Patient stable  xrays n/a Mobilization with physical therapy Encourage incentive spirometry Continue care  Advance diet Up with therapy  1.  Overall patient is doing exceptionally well. 2.  Drain output since surgery has been 58 cc.  There is been no swelling or bleeding.  Dressings remain clean and dry. 3.  Patient's radicular arm pain is significantly improved, and he reports resolution of the numbness/dysesthesias. 4.  Plan on discharge to home later this morning.  We will remove the drain at around 8 AM and discharge planning for 11 AM provided there is no swelling or bleeding noted. 5.  Patient will follow-up with me in 2 weeks for wound  check.  Prescriptions for Percocet, Robaxin, and if needed Zofran have been provided.    Venita Lick, MD [facility] [phone]

## 2020-05-17 NOTE — Evaluation (Signed)
Occupational Therapy Evaluation and Discharge Patient Details Name: Preston Ortiz MRN: [id number] DOB: [birth year] Today's Date: 05/17/2020    History of Present Illness Pt is a 64 year old man admitted for C5-C7 ACDF. PMH: smoker, HTN, OA, obesity, THA.   Clinical Impression   Pt functions independently at baseline. Currently requiring up to min assist for ADL. He has a sock aid at home, recommended a reacher and long handled bath sponge. Pt has a supportive girlfriend to assist as needed. Educated pt in compensatory strategies for ADL and IADL to avoid. Reinforced with written handout. Pt verbalized understanding. No further OT needs.    Follow Up Recommendations  No OT follow up    Equipment Recommendations  None recommended by OT    Recommendations for Other Services       Precautions / Restrictions Precautions Precautions: Cervical Precaution Booklet Issued: Yes (comment) Required Braces or Orthoses: Cervical Brace Cervical Brace: Hard collar      Mobility Bed Mobility Overal bed mobility: Modified Independent             General bed mobility comments: HOB up, pt has an adjustable bed at home  Transfers Overall transfer level: Modified independent Equipment used: None                  Balance                                           ADL either performed or assessed with clinical judgement   ADL Overall ADL's : Needs assistance/impaired Eating/Feeding: Independent   Grooming: Modified independent;Standing   Upper Body Bathing: Minimal assistance;Sitting   Lower Body Bathing: Minimal assistance   Upper Body Dressing : Set up;Sitting   Lower Body Dressing: Minimal assistance;Sit to/from stand   Toilet Transfer: Modified Stage manager and Hygiene: Modified independent       Functional mobility during ADLs: Modified independent General ADL Comments: Educated in compensatory strategies  for ADL and IADL to avoid     Vision Baseline Vision/History: Wears glasses Patient Visual Report: No change from baseline       Perception     Praxis      Pertinent Vitals/Pain Pain Assessment: Faces Faces Pain Scale: Hurts a little bit Pain Location: incision Pain Descriptors / Indicators: Sore Pain Intervention(s): Monitored during session     Hand Dominance Right   Extremity/Trunk Assessment Upper Extremity Assessment Upper Extremity Assessment: Overall WFL for tasks assessed   Lower Extremity Assessment Lower Extremity Assessment: Defer to PT evaluation       Communication Communication Communication: No difficulties   Cognition Arousal/Alertness: Awake/alert Behavior During Therapy: WFL for tasks assessed/performed Overall Cognitive Status: Within Functional Limits for tasks assessed                                     General Comments       Exercises     Shoulder Instructions      Home Living Family/patient expects to be discharged to:: Private residence Living Arrangements: Spouse/significant other (girlfriend) Available Help at Discharge: Family;Available 24 hours/day Type of Home: House Home Access: Stairs to enter [employer] of Steps: 3 Entrance Stairs-Rails: Right Home Layout: One level     Bathroom Shower/Tub: Chief Strategy Officer:  Handicapped height     Home Equipment: Grab bars - toilet;Adaptive equipment Adaptive Equipment: Sock aid Additional Comments: recommended long handled bath sponge and reacher, pt will rely on his girlfriend to assist      Prior Functioning/Environment Level of Independence: Independent        Comments: unable to work        OT Problem List:        OT Treatment/Interventions:      OT Goals(Current goals can be found in the care plan section) Acute Rehab OT Goals Patient Stated Goal: return home  OT Frequency:     Barriers to D/C:             Co-evaluation              AM-PAC OT "6 Clicks" Daily Activity     Outcome Measure Help from another person eating meals?: None Help from another person taking care of personal grooming?: None Help from another person toileting, which includes using toliet, bedpan, or urinal?: None Help from another person bathing (including washing, rinsing, drying)?: None Help from another person to put on and taking off regular upper body clothing?: None Help from another person to put on and taking off regular lower body clothing?: None 6 Click Score: 24   End of Session Equipment Utilized During Treatment: Cervical collar  Activity Tolerance: Patient tolerated treatment well Patient left: in bed;with call bell/phone within reach  OT Visit Diagnosis: Pain                Time: 0805-0820 OT Time Calculation (min): 15 min Charges:  OT General Charges $OT Visit: 1 Visit OT Evaluation $OT Eval Low Complexity: 1 Low  Martie Round, OTR/L Acute Rehabilitation Services Pager: [phone] Office: [phone] Evern Bio 05/17/2020, 8:44 AM

## 2020-05-17 NOTE — Plan of Care (Signed)
Patient alert and oriented, mae's well, voiding adequate amount of urine, swallowing without difficulty, no c/o pain at time of discharge. Patient discharged home with family. Script and discharged instructions given to patient. Patient and family stated understanding of instructions given. Patient has an appointment with Dr. Brooks  

## 2020-05-20 NOTE — Discharge Summary (Signed)
Patient ID: Preston Ortiz MRN: [id number] DOB/AGE: 63-25-1957 63 y.o.  Admit date: 05/16/2020 Discharge date: 05/20/2020  Admission Diagnoses:  Active Problems:   Cervical disc herniation   Discharge Diagnoses:  Active Problems:   Cervical disc herniation  status post Procedure(s): ANTERIOR CERVICAL DECOMPRESSION/DISCECTOMY FUSION CERVICAL FIVE THROUGH CERVICAL SEVEN EVACUATION HEMATOMA  Past Medical History:  Diagnosis Date  . Arthritis    osteoathritis  . GERD (gastroesophageal reflux disease)    ocassional  . Hypertension     Surgeries: Procedure(s): ANTERIOR CERVICAL DECOMPRESSION/DISCECTOMY FUSION CERVICAL FIVE THROUGH CERVICAL SEVEN EVACUATION HEMATOMA on 05/16/2020   Consultants:   Discharged Condition: Improved  Hospital Course: Preston Ortiz is an 64 y.o. male who was admitted 05/16/2020 for operative treatment of Cervical spondylitic radiculopathy C5-7. Patient failed conservative treatments (please see the history and physical for the specifics) and had severe unremitting pain that affects sleep, daily activities and work/hobbies. After pre-op clearance, the patient was taken to the operating room on 05/16/2020 and underwent  Procedure(s): ANTERIOR CERVICAL DECOMPRESSION/DISCECTOMY FUSION CERVICAL FIVE THROUGH CERVICAL SEVEN EVACUATION HEMATOMA.    Patient was given perioperative antibiotics:  Anti-infectives (From admission, onward)   Start     Dose/Rate Route Frequency Ordered Stop   05/16/20 2200  vancomycin (VANCOCIN) IVPB 1000 mg/200 mL premix  Status:  Discontinued        1,000 mg 200 mL/hr over 60 Minutes Intravenous Every 12 hours 05/16/20 1707 05/17/20 0530   05/16/20 0600  vancomycin (VANCOREADY) IVPB 1500 mg/300 mL        1,500 mg 150 mL/hr over 120 Minutes Intravenous 120 min pre-op 05/15/20 0732 05/16/20 1126       Patient was given sequential compression devices and early ambulation to prevent DVT.   Patient benefited maximally  from hospital stay and there were no complications. At the time of discharge, the patient was urinating/moving their bowels without difficulty, tolerating a regular diet, pain is controlled with oral pain medications and they have been cleared by PT/OT.   Recent vital signs: No data found.   Recent laboratory studies: No results for input(s): WBC, HGB, HCT, PLT, NA, K, CL, CO2, BUN, CREATININE, GLUCOSE, INR, CALCIUM in the last 72 hours.  Invalid input(s): PT, 2   Discharge Medications:   Allergies as of 05/17/2020      Reactions   Cyclobenzaprine    Blurred vision    Gabapentin    Bloody stools    Penicillins    childhood allergy  Has patient had a PCN reaction causing immediate rash, facial/tongue/throat swelling, SOB or lightheadedness with hypotension: Unknown Has patient had a PCN reaction causing severe rash involving mucus membranes or skin necrosis: Unknown Has patient had a PCN reaction that required hospitalization Unknown Has patient had a PCN reaction occurring within the last 10 years: No If all of the above answers are "NO", then may proceed with Cephalosporin use.      Medication List    STOP taking these medications   SLEEP AID PO     TAKE these medications   amLODipine 10 MG tablet Commonly known as: NORVASC Take 1 tablet (10 mg total) by mouth daily.   methocarbamol 500 MG tablet Commonly known as: Robaxin Take 1 tablet (500 mg total) by mouth every 8 (eight) hours as needed for up to 5 days for muscle spasms.   ondansetron 4 MG tablet Commonly known as: Zofran Take 1 tablet (4 mg total) by mouth every 8 (eight) hours as needed  for nausea or vomiting.   oxyCODONE-acetaminophen 10-325 MG tablet Commonly known as: Percocet Take 1 tablet by mouth every 6 (six) hours as needed for up to 5 days for pain.       Diagnostic Studies: DG Chest 2 View  Result Date: 05/14/2020 CLINICAL DATA:  64 year old male under preoperative evaluation prior to anterior  cervical discectomy infusion. EXAM: CHEST - 2 VIEW COMPARISON:  No priors. FINDINGS: Lung volumes are normal. No consolidative airspace disease. No pleural effusions. No pneumothorax. No pulmonary nodule or mass noted. Pulmonary vasculature and the cardiomediastinal silhouette are within normal limits. IMPRESSION: No radiographic evidence of acute cardiopulmonary disease. Electronically Signed   By: Trudie Reed M.D.   On: 05/14/2020 15:29   DG Cervical Spine 2-3 Views  Result Date: 05/16/2020 CLINICAL DATA:  Cervical fusion EXAM: DG C-ARM 1-60 MIN; CERVICAL SPINE - 2-3 VIEW COMPARISON:  None. FLUOROSCOPY TIME:  Fluoroscopy Time:  29 seconds Radiation Exposure Index (if provided by the fluoroscopic device): 3.85 mGy Number of Acquired Spot Images: 2 FINDINGS: Cervical fusion is seen at C5-6 and C6-7 with anterior fixation. No acute abnormality is noted. IMPRESSION: C5-C7 cervical fusion. Electronically Signed   By: Alcide Clever M.D.   On: 05/16/2020 14:52   DG C-Arm 1-60 Min  Result Date: 05/16/2020 CLINICAL DATA:  Cervical fusion EXAM: DG C-ARM 1-60 MIN; CERVICAL SPINE - 2-3 VIEW COMPARISON:  None. FLUOROSCOPY TIME:  Fluoroscopy Time:  29 seconds Radiation Exposure Index (if provided by the fluoroscopic device): 3.85 mGy Number of Acquired Spot Images: 2 FINDINGS: Cervical fusion is seen at C5-6 and C6-7 with anterior fixation. No acute abnormality is noted. IMPRESSION: C5-C7 cervical fusion. Electronically Signed   By: Alcide Clever M.D.   On: 05/16/2020 14:52    Discharge Instructions    Incentive spirometry RT   Complete by: As directed        Follow-up Information    Venita Lick, MD In 2 weeks.   Specialty: Orthopedic Surgery Why: If symptoms worsen, For suture removal, For wound re-check Contact information: [address] [place] [address] [phone]               Discharge Plan:  discharge to home  Disposition: stable    Signed: Leonette Monarch  Ka Flammer for [facility] PA-C [facility] [phone] 05/20/2020, 10:41 AM

## 2020-05-21 ENCOUNTER — Encounter (HOSPITAL_COMMUNITY): Payer: Self-pay | Admitting: Orthopedic Surgery

## 2020-09-19 ENCOUNTER — Other Ambulatory Visit: Payer: Self-pay

## 2020-09-19 ENCOUNTER — Inpatient Hospital Stay (HOSPITAL_COMMUNITY)
Admission: EM | Admit: 2020-09-19 | Discharge: 2020-09-21 | DRG: 247 | Disposition: A | Discharge status: Home / Self Care | Payer: Self-pay | Attending: Cardiology | Admitting: Cardiology

## 2020-09-19 ENCOUNTER — Encounter (HOSPITAL_COMMUNITY): Payer: Self-pay | Admitting: Emergency Medicine

## 2020-09-19 ENCOUNTER — Emergency Department (HOSPITAL_COMMUNITY): Payer: Self-pay

## 2020-09-19 DIAGNOSIS — T461X6A Underdosing of calcium-channel blockers, initial encounter: Secondary | ICD-10-CM | POA: Diagnosis present

## 2020-09-19 DIAGNOSIS — I472 Ventricular tachycardia: Secondary | ICD-10-CM | POA: Diagnosis present

## 2020-09-19 DIAGNOSIS — Z791 Long term (current) use of non-steroidal anti-inflammatories (NSAID): Secondary | ICD-10-CM

## 2020-09-19 DIAGNOSIS — E669 Obesity, unspecified: Secondary | ICD-10-CM | POA: Diagnosis present

## 2020-09-19 DIAGNOSIS — I10 Essential (primary) hypertension: Secondary | ICD-10-CM

## 2020-09-19 DIAGNOSIS — Z955 Presence of coronary angioplasty implant and graft: Secondary | ICD-10-CM

## 2020-09-19 DIAGNOSIS — Z888 Allergy status to other drugs, medicaments and biological substances status: Secondary | ICD-10-CM

## 2020-09-19 DIAGNOSIS — Z6837 Body mass index (BMI) 37.0-37.9, adult: Secondary | ICD-10-CM

## 2020-09-19 DIAGNOSIS — Z91138 Patient's unintentional underdosing of medication regimen for other reason: Secondary | ICD-10-CM

## 2020-09-19 DIAGNOSIS — Z96641 Presence of right artificial hip joint: Secondary | ICD-10-CM | POA: Diagnosis present

## 2020-09-19 DIAGNOSIS — J449 Chronic obstructive pulmonary disease, unspecified: Secondary | ICD-10-CM | POA: Diagnosis present

## 2020-09-19 DIAGNOSIS — Z880 Allergy status to penicillin: Secondary | ICD-10-CM

## 2020-09-19 DIAGNOSIS — I214 Non-ST elevation (NSTEMI) myocardial infarction: Principal | ICD-10-CM | POA: Diagnosis present

## 2020-09-19 DIAGNOSIS — Z981 Arthrodesis status: Secondary | ICD-10-CM

## 2020-09-19 DIAGNOSIS — Z87891 Personal history of nicotine dependence: Secondary | ICD-10-CM

## 2020-09-19 DIAGNOSIS — Z79899 Other long term (current) drug therapy: Secondary | ICD-10-CM

## 2020-09-19 DIAGNOSIS — E785 Hyperlipidemia, unspecified: Secondary | ICD-10-CM

## 2020-09-19 DIAGNOSIS — I2510 Atherosclerotic heart disease of native coronary artery without angina pectoris: Secondary | ICD-10-CM | POA: Diagnosis present

## 2020-09-19 DIAGNOSIS — Z20822 Contact with and (suspected) exposure to covid-19: Secondary | ICD-10-CM | POA: Diagnosis present

## 2020-09-19 LAB — BASIC METABOLIC PANEL
Anion gap: 7 (ref 5–15)
BUN: 10 mg/dL (ref 8–23)
CO2: 27 mmol/L (ref 22–32)
Calcium: 8.9 mg/dL (ref 8.9–10.3)
Chloride: 98 mmol/L (ref 98–111)
Creatinine, Ser: 0.79 mg/dL (ref 0.61–1.24)
GFR, Estimated: >60 mL/min (ref >60)
Glucose, Bld: 147 mg/dL — ABNORMAL HIGH (ref 70–99)
Potassium: 3.6 mmol/L (ref 3.5–5.1)
Sodium: 132 mmol/L — ABNORMAL LOW (ref 135–145)

## 2020-09-19 LAB — RESP PANEL BY RT-PCR (FLU A&B, COVID) ARPGX2
Influenza A by PCR: NEGATIVE
Influenza B by PCR: NEGATIVE
SARS Coronavirus 2 by RT PCR: NEGATIVE

## 2020-09-19 LAB — CBC
HCT: 43.3 % (ref 39.0–52.0)
Hemoglobin: 14.8 g/dL (ref 13.0–17.0)
MCH: 32.9 pg (ref 26.0–34.0)
MCHC: 34.2 g/dL (ref 30.0–36.0)
MCV: 96.2 fL (ref 80.0–100.0)
Platelets: 268 10*3/uL (ref 150–400)
RBC: 4.5 MIL/uL (ref 4.22–5.81)
RDW: 12.7 % (ref 11.5–15.5)
WBC: 10.2 10*3/uL (ref 4.0–10.5)
nRBC: 0 % (ref 0.0–0.2)

## 2020-09-19 LAB — PROTIME-INR
INR: 1 (ref 0.8–1.2)
Prothrombin Time: 12.6 seconds (ref 11.4–15.2)

## 2020-09-19 LAB — TROPONIN I (HIGH SENSITIVITY): Troponin I (High Sensitivity): 8998 ng/L (ref <18)

## 2020-09-19 MED ORDER — HEPARIN BOLUS VIA INFUSION
4000.0000 [IU] | Freq: Once | INTRAVENOUS | Status: AC
  Administered 2020-09-19: 4000 [IU] via INTRAVENOUS

## 2020-09-19 MED ORDER — AMLODIPINE BESYLATE 5 MG PO TABS
10.0000 mg | ORAL_TABLET | Freq: Once | ORAL | Status: AC
  Administered 2020-09-19: 10 mg via ORAL
  Filled 2020-09-19: qty 2

## 2020-09-19 MED ORDER — METOPROLOL TARTRATE 25 MG PO TABS
25.0000 mg | ORAL_TABLET | Freq: Two times a day (BID) | ORAL | Status: DC
  Administered 2020-09-20 – 2020-09-21 (×4): 25 mg via ORAL
  Filled 2020-09-19 (×4): qty 1

## 2020-09-19 MED ORDER — ATORVASTATIN CALCIUM 80 MG PO TABS
80.0000 mg | ORAL_TABLET | Freq: Every day | ORAL | Status: DC
  Administered 2020-09-20 – 2020-09-21 (×3): 80 mg via ORAL
  Filled 2020-09-19 (×3): qty 1

## 2020-09-19 MED ORDER — NITROGLYCERIN 0.4 MG SL SUBL
0.4000 mg | SUBLINGUAL_TABLET | Freq: Once | SUBLINGUAL | Status: AC
  Administered 2020-09-19: 0.4 mg via SUBLINGUAL
  Filled 2020-09-19: qty 1

## 2020-09-19 MED ORDER — AMLODIPINE BESYLATE 10 MG PO TABS: 10.0000 mg | ORAL_TABLET | Freq: Every day | ORAL | Status: DC

## 2020-09-19 MED ORDER — HEPARIN (PORCINE) 25000 UT/250ML-% IV SOLN
1500.0000 [IU]/h | INTRAVENOUS | Status: DC
  Administered 2020-09-19: 1400 [IU]/h via INTRAVENOUS
  Filled 2020-09-19: qty 250

## 2020-09-19 MED ORDER — ASPIRIN 81 MG PO CHEW
324.0000 mg | CHEWABLE_TABLET | Freq: Once | ORAL | Status: AC
  Administered 2020-09-19: 324 mg via ORAL
  Filled 2020-09-19: qty 4

## 2020-09-19 MED ORDER — ASPIRIN EC 81 MG PO TBEC
81.0000 mg | DELAYED_RELEASE_TABLET | Freq: Every day | ORAL | Status: DC
  Administered 2020-09-21: 81 mg via ORAL
  Filled 2020-09-19: qty 1

## 2020-09-19 NOTE — ED Notes (Signed)
Report to [facility] attempted x1

## 2020-09-19 NOTE — ED Triage Notes (Signed)
Pt complains of central chest pain that began at 0500 this morning. Pt states he is also having back pain for the last 3 hours. Pt ran out of BP meds 2 weeks ago.

## 2020-09-19 NOTE — Progress Notes (Signed)
ANTICOAGULATION CONSULT NOTE - Initial Consult  Pharmacy Consult:  Heparin Indication: chest pain/ACS  Allergies  Allergen Reactions  . Cyclobenzaprine     Blurred vision   . Gabapentin     Bloody stools   . Penicillins     childhood allergy  Has patient had a PCN reaction causing immediate rash, facial/tongue/throat swelling, SOB or lightheadedness with hypotension: Unknown Has patient had a PCN reaction causing severe rash involving mucus membranes or skin necrosis: Unknown Has patient had a PCN reaction that required hospitalization Unknown Has patient had a PCN reaction occurring within the last 10 years: No If all of the above answers are "NO", then may proceed with Cephalosporin use.     Patient Measurements: Height: 5\' 10"  (177.8 cm) Weight: 117.9 kg (260 lb) IBW/kg (Calculated) : 73 Heparin Dosing Weight: 99 kg  Vital Signs: Temp: 98.4 F (36.9 C) (11/25 1627) Temp Source: Oral (11/25 1627) BP: 154/97 (11/25 1841) Pulse Rate: 84 (11/25 1841)  Labs: Recent Labs    09/19/20 1712  HGB 14.8  HCT 43.3  PLT 268  LABPROT 12.6  INR 1.0  CREATININE 0.79  TROPONINIHS 8,998*    Estimated Creatinine Clearance: 121.6 mL/min (by C-G formula based on SCr of 0.79 mg/dL).   Medical History: Past Medical History:  Diagnosis Date  . Arthritis    osteoathritis  . GERD (gastroesophageal reflux disease)    ocassional  . Hypertension     Assessment: 20 YOM presented with chest pain and elevated troponin.  Pharmacy consulted to manage IV heparin for ACS.  Baseline labs and home meds reviewed.  Goal of Therapy:  Heparin level 0.3-0.7 units/ml Monitor platelets by anticoagulation protocol: Yes   Plan:  Heparin 4000 units IV bolus, then Heparin gtt at 1400 units/hr Check 6 hr heparin level Daily heparin level and CBC  Hallis Meditz D. 65, PharmD, BCPS, BCCCP 09/19/2020, 7:02 PM

## 2020-09-19 NOTE — ED Provider Notes (Signed)
[facility] EMERGENCY DEPARTMENT Provider Note   CSN: [id number] Arrival date & time: 09/19/20  1528     History Chief Complaint  Patient presents with  . Chest Pain    Zenaida DeedDouglas E Chawla is a 64 y.o. male with past medical history significant for hypertension and GERD who presents to the ED with complaints of elevated blood pressure and chest pain.  On my examination, patient reports that his blood pressure has been well controlled with amlodipine 10 mg daily, but a couple weeks ago he ran out of his medications. He does not have a primary care provider due to insurance. He tried to schedule an appointment with the Department of Health [place] in [place], however unsuccessfully. He was unaware of the [facility] and would appreciate their contact information. Patient reports that this morning he woke up with central chest pain that radiated towards his back. He subsequently went back to bed. Upon awaking shortly thereafter, he noticed persistent discomfort, although less severe. He states that it has been constant since its initial onset around 5 AM this morning. He also had associated cold sweats.  No obvious aggravating or alleviating factors. He endorses a significant history of tobacco use disorder, but quit in July prior to an ACDF procedure. He denies any other significant past medical history. He states that he is generally a healthy individual. He checks his blood pressure several times each day and noticed that it has been persistently elevated for the past week.   He denies any recent illness or infection, fevers or chills, shortness of breath or significant cough, abdominal pain, nausea or vomiting, numbness or weakness, blurred vision, or other focal neurologic deficits.  HPI     Past Medical History:  Diagnosis Date  . Arthritis    osteoathritis  . GERD (gastroesophageal reflux disease)    ocassional  . Hypertension     Patient  Active Problem List   Diagnosis Date Noted  . NSTEMI (non-ST elevated myocardial infarction) (HCC) 09/19/2020  . Cervical disc herniation 05/16/2020  . Unilateral primary osteoarthritis, right hip 11/06/2016  . 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 ANTERIOR CERVICAL DECOMP/DISCECTOMY FUSION N/A 05/16/2020   Procedure: ANTERIOR CERVICAL DECOMPRESSION/DISCECTOMY FUSION CERVICAL FIVE THROUGH CERVICAL SEVEN;  Surgeon: Venita LickBrooks, Dahari, MD;  Location: [facility] OR;  Service: Orthopedics;  Laterality: N/A;  3.5 hrs Needs pre op anesthesia consult-doesn't have a PCP  . HEMATOMA EVACUATION N/A 05/16/2020   Procedure: EVACUATION HEMATOMA;  Surgeon: Venita LickBrooks, Dahari, MD;  [facility];  Service: Orthopedics;  Laterality: N/A;  . TONSILLECTOMY     chilhood  . TOTAL HIP ARTHROPLASTY Right 11/06/2016   Procedure: RIGHT TOTAL HIP ARTHROPLASTY ANTERIOR APPROACH;  Surgeon: Kathryne Hitchhristopher Y Blackman, MD;  Location: [facility] ORS;  Service: Orthopedics;  Laterality: Right;       Family History  Problem Relation Age of Onset  . Cancer Mother   . CAD Neg Hx     Social History   Tobacco Use  . Smoking status: Current Every Day Smoker    Packs/day: 1.00    Years: 40.00    Pack years: 40.00    Types: Cigarettes  . Smokeless tobacco: Never Used  Vaping Use  . Vaping Use: Never used  Substance Use Topics  . Alcohol use: Yes    Comment: half a pint of liquor Q3 days   . Drug use: No  Home Medications Prior to Admission medications   Medication Sig Start Date End Date Taking? Authorizing Provider  amLODipine (NORVASC) 10 MG tablet Take 1 tablet (10 mg total) by mouth daily. 01/25/20  Yes Avegno, Zachery Dakins, FNP  celecoxib (CELEBREX) 200 MG capsule Take 1 capsule by mouth in the morning and at bedtime. 07/04/20  Yes [provider]  ondansetron (ZOFRAN) 4 MG tablet Take 1 tablet (4 mg total) by mouth every 8 (eight) hours as needed for  nausea or vomiting. 05/16/20   Venita Lick, MD    Allergies    Cyclobenzaprine, Gabapentin, and Penicillins  Review of Systems   Review of Systems  All other systems reviewed and are negative.   Physical Exam Updated Vital Signs BP (!) 146/108   Pulse 74   Temp 98.4 F (36.9 C) (Oral)   Resp 17   Ht 5\' 10"  (1.778 m)   Wt 117.9 kg   SpO2 99%   BMI 37.31 kg/m   Physical Exam Vitals and nursing note reviewed. Exam conducted with a chaperone present.  Constitutional:      General: He is not in acute distress.    Appearance: Normal appearance. He is not ill-appearing.  HENT:     Head: Normocephalic and atraumatic.  Eyes:     General: No scleral icterus.    Conjunctiva/sclera: Conjunctivae normal.  Cardiovascular:     Rate and Rhythm: Normal rate and regular rhythm.     Pulses: Normal pulses.     Heart sounds: Normal heart sounds.     Comments: Peripheral pulses intact and symmetric. Pulmonary:     Effort: Pulmonary effort is normal. No respiratory distress.     Breath sounds: Normal breath sounds. No stridor. No wheezing, rhonchi or rales.     Comments: CTA bilaterally. No increased work of breathing. Abdominal:     General: Abdomen is flat. There is no distension.     Palpations: Abdomen is soft.     Tenderness: There is no abdominal tenderness. There is no guarding.  Musculoskeletal:     Cervical back: Normal range of motion.     Right lower leg: No edema.     Left lower leg: No edema.  Skin:    General: Skin is dry.     Capillary Refill: Capillary refill takes less than 2 seconds.  Neurological:     General: No focal deficit present.     Mental Status: He is alert and oriented to person, place, and time.     GCS: GCS eye subscore is 4. GCS verbal subscore is 5. GCS motor subscore is 6.  Psychiatric:        Mood and Affect: Mood normal.        Behavior: Behavior normal.        Thought Content: Thought content normal.     ED Results / Procedures /  Treatments   Labs (all labs ordered are listed, but only abnormal results are displayed) Labs Reviewed  BASIC METABOLIC PANEL - Abnormal; Notable for the following components:      Result Value   Sodium 132 (*)    Glucose, Bld 147 (*)    All other components within normal limits  TROPONIN I (HIGH SENSITIVITY) - Abnormal; Notable for the following components:   Troponin I (High Sensitivity) 8,998 (*)    All other components within normal limits  RESP PANEL BY RT-PCR (FLU A&B, COVID) ARPGX2  CBC  PROTIME-INR  HEPARIN LEVEL (UNFRACTIONATED)  CBC  EKG EKG Interpretation  Date/Time:  Thursday September 19 2020 15:40:07 EST Ventricular Rate:  91 PR Interval:    QRS Duration: 140 QT Interval:  391 QTC Calculation: 482 R Axis:   -44 Text Interpretation: Sinus rhythm RBBB and LAFB Baseline wander in lead(s) V2 Bifasciular block noted on May 13 2020 ecg No STEMI Confirmed by Alvester Chou [phone]) on 09/19/2020 3:44:11 PM   Radiology DG Chest Portable 1 View  Result Date: 09/19/2020 CLINICAL DATA:  Cough, chest pain EXAM: PORTABLE CHEST 1 VIEW COMPARISON:  05/13/2020 FINDINGS: The heart size and mediastinal contours are within normal limits. Mildly prominent interstitial markings bilaterally are similar to prior. No focal airspace consolidation, pleural effusion, or pneumothorax. Lower cervical ACDF hardware. Degenerative changes of the bilateral AC joints. IMPRESSION: Mildly prominent interstitial markings bilaterally are similar to prior and may reflect bronchitic type lung changes. No focal airspace consolidation. Electronically Signed   By: Duanne Guess D.O.   On: 09/19/2020 16:29    Procedures .Critical Care Performed by: Lorelee New, PA-C Authorized by: Lorelee New, PA-C   Critical care provider statement:    Critical care time (minutes):  45   Critical care was necessary to treat or prevent imminent or life-threatening deterioration of the following  conditions:  Cardiac failure   Critical care was time spent personally by me on the following activities:  Discussions with consultants, evaluation of patient's response to treatment, examination of patient, ordering and performing treatments and interventions, ordering and review of laboratory studies, ordering and review of radiographic studies, pulse oximetry, re-evaluation of patient's condition, obtaining history from patient or surrogate and review of old charts Comments:     NSTEMI   (including critical care time)  Medications Ordered in ED Medications  heparin ADULT infusion 100 units/mL (25000 units/236mL sodium chloride 0.45%) (1,400 Units/hr Intravenous New Bag/Given 09/19/20 1925)  amLODipine (NORVASC) tablet 10 mg (10 mg Oral Given 09/19/20 1629)  aspirin chewable tablet 324 mg (324 mg Oral Given 09/19/20 1818)  nitroGLYCERIN (NITROSTAT) SL tablet 0.4 mg (0.4 mg Sublingual Given 09/19/20 1820)  heparin bolus via infusion 4,000 Units (4,000 Units Intravenous Bolus from Bag 09/19/20 1926)    ED Course  I have reviewed the triage vital signs and the nursing notes.  Pertinent labs & imaging results that were available during my care of the patient were reviewed by me and considered in my medical decision making (see chart for details).  Clinical Course as of Sep 20 1951  Thu Sep 19, 2020  1836 Spoke with Dr. Cristal Deer with cardiology, heart care, who will accept patient for admission and plan for cardiac catheterization first thing tomorrow.  Patient will be transferred to [facility] for admission.   [GG]    Clinical Course User Index [GG] Lorelee New, PA-C   MDM Rules/Calculators/A&P                          Given CP radiating to the back in the context of HTN and 30+ pack year smoking history, will obtain basic laboratory work-up and plain films of chest.  Patient informs me that his blood pressure was persistently 120s over 80s while on 10 mg amlodipine daily.    Labs Troponin: Elevated at 8998. BMP: No renal impairment.  Mild hyponatremia 132. CBC: Unremarkable.  Plain films obtained of chest demonstrate mildly prominent interstitial markings bilaterally similar to prior films, possibly reflective of bronchitic changes.  Received notification that troponin  was elevated to 8998.  Placed a consult to cardiology for admission.  Patient will likely require cardiac catheterization concern for ACS.  Lower suspicion for dissection at this time.  Patient's chest pain is persistent, although not particularly severe.  He is in no acute distress.  We will provide patient with 324 mg aspirin to chew.  We will also provide him with sublingual nitroglycerin and heparin per pharmacy.  Spoke with Dr. Cristal Deer with cardiology, heart care, who will accept patient for admission and plan for cardiac catheterization first thing tomorrow.  Patient will be transferred to [facility] for admission.   Final Clinical Impression(s) / ED Diagnoses Final diagnoses:  NSTEMI (non-ST elevated myocardial infarction) [facility])    Rx / DC Orders ED Discharge Orders    None       Lorelee New, PA-C 09/19/20 1953    Terald Sleeper, MD 09/20/20 1011

## 2020-09-19 NOTE — ED Notes (Signed)
Date and time results received: 09/19/20 1802   Test: Troponin Critical Value: 8,998  Name of Provider Notified: Trifan and Chilton Si  Orders Received? Or Actions Taken?: NA

## 2020-09-19 NOTE — ED Notes (Signed)
Lab drawing blood now

## 2020-09-20 ENCOUNTER — Encounter (HOSPITAL_COMMUNITY)
Admission: EM | Disposition: A | Discharge status: Home / Self Care | Payer: Self-pay | Source: Home / Self Care | Attending: Cardiology

## 2020-09-20 ENCOUNTER — Inpatient Hospital Stay (HOSPITAL_COMMUNITY): Payer: Self-pay

## 2020-09-20 DIAGNOSIS — I214 Non-ST elevation (NSTEMI) myocardial infarction: Secondary | ICD-10-CM

## 2020-09-20 DIAGNOSIS — I10 Essential (primary) hypertension: Secondary | ICD-10-CM

## 2020-09-20 DIAGNOSIS — I2510 Atherosclerotic heart disease of native coronary artery without angina pectoris: Secondary | ICD-10-CM

## 2020-09-20 HISTORY — PX: CORONARY STENT INTERVENTION: CATH118234

## 2020-09-20 HISTORY — PX: LEFT HEART CATH AND CORONARY ANGIOGRAPHY: CATH118249

## 2020-09-20 LAB — ECHOCARDIOGRAM COMPLETE
Area-P 1/2: 2.91 cm2
Height: 70 in
S' Lateral: 3.1 cm
Weight: 4067.2 oz

## 2020-09-20 LAB — CBC
HCT: 39.1 % (ref 39.0–52.0)
Hemoglobin: 13.8 g/dL (ref 13.0–17.0)
MCH: 33.5 pg (ref 26.0–34.0)
MCHC: 35.3 g/dL (ref 30.0–36.0)
MCV: 94.9 fL (ref 80.0–100.0)
Platelets: 293 10*3/uL (ref 150–400)
RBC: 4.12 MIL/uL — ABNORMAL LOW (ref 4.22–5.81)
RDW: 12.9 % (ref 11.5–15.5)
WBC: 10.7 10*3/uL — ABNORMAL HIGH (ref 4.0–10.5)
nRBC: 0 % (ref 0.0–0.2)

## 2020-09-20 LAB — LIPID PANEL
Cholesterol: 183 mg/dL (ref 0–200)
HDL: 41 mg/dL (ref >40)
LDL Cholesterol: 118 mg/dL — ABNORMAL HIGH (ref 0–99)
Total CHOL/HDL Ratio: 4.5 RATIO
Triglycerides: 122 mg/dL (ref <150)
VLDL: 24 mg/dL (ref 0–40)

## 2020-09-20 LAB — HEPARIN LEVEL (UNFRACTIONATED): Heparin Unfractionated: 0.28 IU/mL — ABNORMAL LOW (ref 0.30–0.70)

## 2020-09-20 LAB — POCT ACTIVATED CLOTTING TIME: Activated Clotting Time: 290 seconds

## 2020-09-20 LAB — BASIC METABOLIC PANEL
Anion gap: 13 (ref 5–15)
BUN: 9 mg/dL (ref 8–23)
CO2: 22 mmol/L (ref 22–32)
Calcium: 8.8 mg/dL — ABNORMAL LOW (ref 8.9–10.3)
Chloride: 99 mmol/L (ref 98–111)
Creatinine, Ser: 0.81 mg/dL (ref 0.61–1.24)
GFR, Estimated: >60 mL/min (ref >60)
Glucose, Bld: 143 mg/dL — ABNORMAL HIGH (ref 70–99)
Potassium: 3.6 mmol/L (ref 3.5–5.1)
Sodium: 134 mmol/L — ABNORMAL LOW (ref 135–145)

## 2020-09-20 LAB — HEMOGLOBIN A1C
Hgb A1c MFr Bld: 5.6 % (ref 4.8–5.6)
Mean Plasma Glucose: 114.02 mg/dL

## 2020-09-20 LAB — TROPONIN I (HIGH SENSITIVITY): Troponin I (High Sensitivity): 23389 ng/L (ref <18)

## 2020-09-20 LAB — MAGNESIUM: Magnesium: 2.1 mg/dL (ref 1.7–2.4)

## 2020-09-20 SURGERY — LEFT HEART CATH AND CORONARY ANGIOGRAPHY
Anesthesia: LOCAL

## 2020-09-20 MED ORDER — SODIUM CHLORIDE 0.9 % IV SOLN: 250.0000 mL | INTRAVENOUS | Status: DC | PRN

## 2020-09-20 MED ORDER — SODIUM CHLORIDE 0.9% FLUSH
3.0000 mL | Freq: Two times a day (BID) | INTRAVENOUS | Status: DC
  Administered 2020-09-20 – 2020-09-21 (×2): 3 mL via INTRAVENOUS

## 2020-09-20 MED ORDER — OXYCODONE HCL 5 MG PO TABS
5.0000 mg | ORAL_TABLET | Freq: Once | ORAL | Status: AC
  Administered 2020-09-20: 5 mg via ORAL
  Filled 2020-09-20: qty 1

## 2020-09-20 MED ORDER — TICAGRELOR 90 MG PO TABS
ORAL_TABLET | ORAL | Status: AC
  Filled 2020-09-20: qty 2

## 2020-09-20 MED ORDER — PERFLUTREN LIPID MICROSPHERE
INTRAVENOUS | Status: AC
  Administered 2020-09-20: 3 mL
  Filled 2020-09-20: qty 10

## 2020-09-20 MED ORDER — TICAGRELOR 90 MG PO TABS
90.0000 mg | ORAL_TABLET | Freq: Two times a day (BID) | ORAL | Status: DC
  Administered 2020-09-20 – 2020-09-21 (×2): 90 mg via ORAL
  Filled 2020-09-20 (×2): qty 1

## 2020-09-20 MED ORDER — SODIUM CHLORIDE 0.9% FLUSH: 3.0000 mL | INTRAVENOUS | Status: DC | PRN

## 2020-09-20 MED ORDER — HEPARIN SODIUM (PORCINE) 1000 UNIT/ML IJ SOLN
INTRAMUSCULAR | Status: DC | PRN
  Administered 2020-09-20: 6000 [IU] via INTRAVENOUS
  Administered 2020-09-20: 5000 [IU] via INTRAVENOUS

## 2020-09-20 MED ORDER — LABETALOL HCL 5 MG/ML IV SOLN: 10.0000 mg | INTRAVENOUS | Status: AC | PRN

## 2020-09-20 MED ORDER — HEPARIN (PORCINE) IN NACL 1000-0.9 UT/500ML-% IV SOLN
INTRAVENOUS | Status: DC | PRN
  Administered 2020-09-20: 500 mL

## 2020-09-20 MED ORDER — FENTANYL CITRATE (PF) 100 MCG/2ML IJ SOLN
INTRAMUSCULAR | Status: AC
  Filled 2020-09-20: qty 2

## 2020-09-20 MED ORDER — VERAPAMIL HCL 2.5 MG/ML IV SOLN
INTRAVENOUS | Status: DC | PRN
  Administered 2020-09-20: 10 mL via INTRA_ARTERIAL

## 2020-09-20 MED ORDER — LIDOCAINE HCL (PF) 1 % IJ SOLN
INTRAMUSCULAR | Status: AC
  Filled 2020-09-20: qty 30

## 2020-09-20 MED ORDER — SODIUM CHLORIDE 0.9 % WEIGHT BASED INFUSION
1.0000 mL/kg/h | INTRAVENOUS | Status: AC
  Administered 2020-09-20 (×2): 1 mL/kg/h via INTRAVENOUS

## 2020-09-20 MED ORDER — IOHEXOL 350 MG/ML SOLN
INTRAVENOUS | Status: DC | PRN
  Administered 2020-09-20: 120 mL

## 2020-09-20 MED ORDER — ACETAMINOPHEN 325 MG PO TABS: 650.0000 mg | ORAL_TABLET | Freq: Four times a day (QID) | ORAL | Status: DC | PRN

## 2020-09-20 MED ORDER — NITROGLYCERIN 1 MG/10 ML FOR IR/CATH LAB
INTRA_ARTERIAL | Status: AC
  Filled 2020-09-20: qty 10

## 2020-09-20 MED ORDER — HEPARIN SODIUM (PORCINE) 1000 UNIT/ML IJ SOLN
INTRAMUSCULAR | Status: AC
  Filled 2020-09-20: qty 1

## 2020-09-20 MED ORDER — HEPARIN (PORCINE) IN NACL 1000-0.9 UT/500ML-% IV SOLN
INTRAVENOUS | Status: AC
  Filled 2020-09-20: qty 1000

## 2020-09-20 MED ORDER — MIDAZOLAM HCL 2 MG/2ML IJ SOLN
INTRAMUSCULAR | Status: AC
  Filled 2020-09-20: qty 2

## 2020-09-20 MED ORDER — SODIUM CHLORIDE 0.9% FLUSH: 3.0000 mL | Freq: Two times a day (BID) | INTRAVENOUS | Status: DC

## 2020-09-20 MED ORDER — NITROGLYCERIN 1 MG/10 ML FOR IR/CATH LAB
INTRA_ARTERIAL | Status: DC | PRN
  Administered 2020-09-20 (×2): 200 ug via INTRACORONARY

## 2020-09-20 MED ORDER — SODIUM CHLORIDE 0.9 % WEIGHT BASED INFUSION
3.0000 mL/kg/h | INTRAVENOUS | Status: DC
  Administered 2020-09-20: 3 mL/kg/h via INTRAVENOUS

## 2020-09-20 MED ORDER — MIDAZOLAM HCL 2 MG/2ML IJ SOLN
INTRAMUSCULAR | Status: DC | PRN
  Administered 2020-09-20: 2 mg via INTRAVENOUS

## 2020-09-20 MED ORDER — TICAGRELOR 90 MG PO TABS
ORAL_TABLET | ORAL | Status: DC | PRN
  Administered 2020-09-20: 180 mg via ORAL

## 2020-09-20 MED ORDER — VERAPAMIL HCL 2.5 MG/ML IV SOLN
INTRAVENOUS | Status: AC
  Filled 2020-09-20: qty 2

## 2020-09-20 MED ORDER — FENTANYL CITRATE (PF) 100 MCG/2ML IJ SOLN
INTRAMUSCULAR | Status: DC | PRN
  Administered 2020-09-20: 25 ug via INTRAVENOUS

## 2020-09-20 MED ORDER — HYDRALAZINE HCL 20 MG/ML IJ SOLN: 10.0000 mg | INTRAMUSCULAR | Status: AC | PRN

## 2020-09-20 MED ORDER — ASPIRIN 81 MG PO CHEW
81.0000 mg | CHEWABLE_TABLET | ORAL | Status: AC
  Administered 2020-09-20: 81 mg via ORAL
  Filled 2020-09-20: qty 1

## 2020-09-20 MED ORDER — SODIUM CHLORIDE 0.9 % WEIGHT BASED INFUSION: 1.0000 mL/kg/h | INTRAVENOUS | Status: DC

## 2020-09-20 MED ORDER — LIDOCAINE HCL (PF) 1 % IJ SOLN
INTRAMUSCULAR | Status: DC | PRN
  Administered 2020-09-20: 2 mL

## 2020-09-20 MED ORDER — ENOXAPARIN SODIUM 40 MG/0.4ML ~~LOC~~ SOLN
40.0000 mg | SUBCUTANEOUS | Status: DC
  Administered 2020-09-21: 40 mg via SUBCUTANEOUS
  Filled 2020-09-20: qty 0.4

## 2020-09-20 SURGICAL SUPPLY — 16 items
BALLN SAPPHIRE 2.5X12 (BALLOONS) ×2
BALLN SAPPHIRE ~~LOC~~ 3.25X12 (BALLOONS) ×1 IMPLANT
BALLOON SAPPHIRE 2.5X12 (BALLOONS) IMPLANT
CATH 5FR JL3.5 JR4 ANG PIG MP (CATHETERS) ×1 IMPLANT
CATH LAUNCHER 6FR JR4 (CATHETERS) ×1 IMPLANT
DEVICE RAD COMP TR BAND LRG (VASCULAR PRODUCTS) ×1 IMPLANT
GLIDESHEATH SLEND SS 6F .021 (SHEATH) ×1 IMPLANT
GUIDEWIRE INQWIRE 1.5J.035X260 (WIRE) IMPLANT
INQWIRE 1.5J .035X260CM (WIRE) ×2
KIT ENCORE 26 ADVANTAGE (KITS) ×1 IMPLANT
KIT HEART LEFT (KITS) ×2 IMPLANT
PACK CARDIAC CATHETERIZATION (CUSTOM PROCEDURE TRAY) ×2 IMPLANT
STENT RESOLUTE ONYX 3.0X26 (Permanent Stent) ×1 IMPLANT
TRANSDUCER W/STOPCOCK (MISCELLANEOUS) ×2 IMPLANT
TUBING CIL FLEX 10 FLL-RA (TUBING) ×2 IMPLANT
WIRE ASAHI PROWATER 180CM (WIRE) ×1 IMPLANT

## 2020-09-20 NOTE — Progress Notes (Signed)
Progress Note  Patient Name: Preston Ortiz Date of Encounter: 09/20/2020  [facility] Cardiologist: No primary care provider on file.   Subjective   Mild chest discomfort this morning.   Inpatient Medications    Scheduled Meds: . aspirin  81 mg Oral Pre-Cath  . aspirin EC  81 mg Oral Daily  . atorvastatin  80 mg Oral Daily  . metoprolol tartrate  25 mg Oral BID   Continuous Infusions: . sodium chloride     Followed by  . sodium chloride    . heparin 1,500 Units/hr (09/20/20 0338)   PRN Meds:    Vital Signs    Vitals:   09/19/20 2230 09/20/20 0000 09/20/20 0019 09/20/20 0339  BP: (!) 142/100 (!) 166/95  118/80  Pulse: 75 73  66  Resp: 16 16  16   Temp:  98.1 F (36.7 C)  98.5 F (36.9 C)  TempSrc:  Oral  Oral  SpO2: 100% 100%  100%  Weight:   115.3 kg   Height:   5\' 10"  (1.778 m)    No intake or output data in the 24 hours ending 09/20/20 0724 Last 3 Weights 09/20/2020 09/19/2020 05/16/2020  Weight (lbs) 254 lb 3.2 oz 260 lb 259 lb 6 oz  Weight (kg) 115.304 kg 117.935 kg 117.652 kg      Telemetry    SR with brief NSVT, PVCs - Personally Reviewed  ECG    SR with RBBB, LAFB - Personally Reviewed  Physical Exam  Pleasant older male, sitting on the side of the bed GEN: No acute distress.   Neck: No JVD Cardiac: RRR, no murmurs, rubs, or gallops.  Respiratory: Clear to auscultation bilaterally. GI: Soft, nontender, non-distended  MS: No edema; No deformity. Neuro:  Nonfocal  Psych: Normal affect   Labs    High Sensitivity Troponin:   Recent Labs  Lab 09/19/20 1712 09/20/20 0207  TROPONINIHS 8,998* 23,389*      Chemistry Recent Labs  Lab 09/19/20 1712 09/20/20 0207  NA 132* 134*  K 3.6 3.6  CL 98 99  CO2 27 22  GLUCOSE 147* 143*  BUN 10 9  CREATININE 0.79 0.81  CALCIUM 8.9 8.8*  GFRNONAA >60 >60  ANIONGAP 7 13     Hematology Recent Labs  Lab 09/19/20 1712 09/20/20 0207  WBC 10.2 10.7*  RBC 4.50 4.12*  HGB 14.8  13.8  HCT 43.3 39.1  MCV 96.2 94.9  MCH 32.9 33.5  MCHC 34.2 35.3  RDW 12.7 12.9  PLT 268 293    BNPNo results for input(s): BNP, PROBNP in the last 168 hours.   DDimer No results for input(s): DDIMER in the last 168 hours.   Radiology    DG Chest Portable 1 View  Result Date: 09/19/2020 CLINICAL DATA:  Cough, chest pain EXAM: PORTABLE CHEST 1 VIEW COMPARISON:  05/13/2020 FINDINGS: The heart size and mediastinal contours are within normal limits. Mildly prominent interstitial markings bilaterally are similar to prior. No focal airspace consolidation, pleural effusion, or pneumothorax. Lower cervical ACDF hardware. Degenerative changes of the bilateral AC joints. IMPRESSION: Mildly prominent interstitial markings bilaterally are similar to prior and may reflect bronchitic type lung changes. No focal airspace consolidation. Electronically Signed   By: 09/21/2020 D.O.   On: 09/19/2020 16:29    Cardiac Studies   N/a   Patient Profile     64 y.o. male with PMH of tobacco use, COPD, cervical fusion, HTN who presented with chest pain and found  to have a NSTEMI.   Assessment & Plan    1. N[place]I: [place] [address] this morning. Had chest pain throughout the evening but was able to fall asleep. Mild chest discomfort this morning. Remains on IV heparin. Will plan for early case this morning.  -- The patient understands that risks included but are not limited to stroke (1 in 1000), death (1 in 1000), kidney failure [usually temporary] (1 in 500), bleeding (1 in 200), allergic reaction [possibly serious] (1 in 200).  -- continue on IV heparin, ASA, statin and BB -- echo pending  2. HTN: has not been consistent with taking his home medications prior to admission. BP improved with the addition of BB therapy  3. NSVT: brief episode noted on telemetry. Continue BB  4. Hx of tobacco use: reports quitting back in July  5. HLD: LDL 118, started on high dose statin on admission  For  questions or updates, please contact [facility] Please consult [email or web address] for contact info under   Signed, Cassandr Cederberg, NP  09/20/2020, 7:24 AM    

## 2020-09-20 NOTE — Interval H&P Note (Signed)
History and Physical Interval Note:  09/20/2020 8:48 AM  Preston Ortiz  has presented today for surgery, with the diagnosis of nonstemi.  The various methods of treatment have been discussed with the patient and family. After consideration of risks, benefits and other options for treatment, the patient has consented to  Procedure(s): LEFT HEART CATH AND CORONARY ANGIOGRAPHY (N/A) as a surgical intervention.  The patient's history has been reviewed, patient examined, no change in status, stable for surgery.  I have reviewed the patient's chart and labs.  Questions were answered to the patient's satisfaction.     Theron Ar[facility] 09/20/2020 8:48 AM Cath Lab Visit (complete for each Cath Lab visit)  Clinical Evaluation Leading to the Procedure:   ACS: Yes.    Non-ACS:    Anginal Classification: CCS IV  Anti-ischemic medical therapy: No Therapy  Non-Invasive Test Results: No non-invasive testing performed  Prior CABG: No previous CABG

## 2020-09-20 NOTE — H&P (Addendum)
CARDIOLOGY ADMISSION NOTE  Patient ID: Preston Ortiz MRN: [id number] DOB/AGE: Jan 06, 1956 64 y.o.  Admit date: 09/19/2020 Primary Physician  - Primary Cardiologist   No primary care provider on file. Chief Complaint   Chest pain  ASSESSMENT AND PLAN:   NSTEMI/Chest pain (trop ~9k) Former smoker/COPD Obesity ?HTN  Plan:  - currently patient chest pain has eased off to 3/10 and he feels fine. We will repeat EKG, trend trops and obtain ECHO. His BP was elevated at OSH, here its getting better, latest 138/80 mmhg, HR 75bpm.  - continue aspirin 81mg , atorva 80mg  bid, and metoprolol 25mg  BID. Keep rate pressure product <7000. If HTNsive then we will use NTG gtt  - his BP in b/l arms are different (L>R), however his back pain is resolved, chest pain improved. CXR does not show mediastinal widening so less likely concerns for dissection. He has NSTEMI and is on heparin gtt and medications - disused about LHC/CA this am and patient is agreeable. Obtain lipid panel and A1c - NPO from now.  I have reviewed the risks, indications, and alternatives to angioplasty and stenting with the patient. Risks include but are not limited to bleeding, infection, vascular injury, stroke, myocardial infection, arrhythmia, kidney injury, radiation-related injury in the case of prolonged fluoroscopy use, emergency cardiac surgery, and death. The patient understands the risks of serious complication is low (<1%) and he agrees to proceed.     HPI:  Preston Ortiz is a 64 y/o male with no major PMH other than former smoker, COPD, cervical fusion sx in July, HTN not taking meds presented to OSH with c/o chest pain and back pain this am  Patient reports he woke up and started having chest pain, substernal, pressure in nature, radiating to the back as well as some back pain from DJD and lasted for a while. Eased off with SL Nitro and aspirin but stayed for a while 3/10 and is now slowly improving but stable. He  denies any other symptoms- sob, heart failure symptoms, dizzy, light headed, n, v, recent covid symptoms  OSH: EKG- nsr no new ischemic changes, trop 8998-> gave aspirin, nitro and started on heparin gtt and transferred to [facility] for further eval. Pt was HTNsive throughout.  Quit smoking in July (significant smoking history). Family h/o largely unknown, denies other drug use. 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 Arthritis    osteoathritis  . GERD (gastroesophageal reflux disease)    ocassional  . Hypertension     Past Surgical History:  Procedure Laterality Date  . ANTERIOR CERVICAL DECOMP/DISCECTOMY FUSION N/A 05/16/2020   Procedure: ANTERIOR CERVICAL DECOMPRESSION/DISCECTOMY FUSION CERVICAL FIVE THROUGH CERVICAL SEVEN;  Surgeon: [facility], MD;  Location: [facility] OR;  Service: Orthopedics;  Laterality: N/A;  3.5 hrs Needs pre op anesthesia consult-doesn't have a PCP  . HEMATOMA EVACUATION N/A 05/16/2020   Procedure: EVACUATION HEMATOMA;  Surgeon: 05/18/2020, MD;  Location: [facility] OR;  Service: Orthopedics;  Laterality: N/A;  . TONSILLECTOMY     chilhood  . TOTAL HIP ARTHROPLASTY Right 11/06/2016   Procedure: RIGHT TOTAL HIP ARTHROPLASTY ANTERIOR APPROACH;  Surgeon: Venita Lick, MD;  Location: [facility] ORS;  Service: Orthopedics;  Laterality: Right;    Allergies  Allergen Reactions  . Cyclobenzaprine     Blurred vision   . Gabapentin     Bloody stools   .  Penicillins     childhood allergy  Has patient had a PCN reaction causing immediate rash, facial/tongue/throat swelling, SOB or lightheadedness with hypotension: Unknown Has patient had a PCN reaction causing severe rash involving mucus membranes or skin necrosis: Unknown Has patient had a PCN reaction that required hospitalization Unknown Has patient had a PCN reaction occurring within the last 10 years: No If all of  the above answers are "NO", then may proceed with Cephalosporin use.    No current facility-administered medications on file prior to encounter.   Current Outpatient Medications on File Prior to Encounter  Medication Sig Dispense Refill  . amLODipine (NORVASC) 10 MG tablet Take 1 tablet (10 mg total) by mouth daily. 90 tablet 0  . celecoxib (CELEBREX) 200 MG capsule Take 1 capsule by mouth in the morning and at bedtime.    . ondansetron (ZOFRAN) 4 MG tablet Take 1 tablet (4 mg total) by mouth every 8 (eight) hours as needed for nausea or vomiting. 20 tablet 0   Social History   Socioeconomic History  . Marital status: Married    Spouse name: Not on file  . Number of children: Not on file  . Years of education: Not on file  . Highest education level: Not on file  Occupational History  . Not on file  Tobacco Use  . Smoking status: Current Every Day Smoker    Packs/day: 1.00    Years: 40.00    Pack years: 40.00    Types: Cigarettes  . Smokeless tobacco: Never Used  Vaping Use  . Vaping Use: Never used  Substance and Sexual Activity  . Alcohol use: Yes    Comment: half a pint of liquor Q3 days   . Drug use: No  . Sexual activity: Yes  Other Topics Concern  . Not on file  Social History Narrative  . Not on file   Social Determinants of Health   Financial Resource Strain:   . Difficulty of Paying Living Expenses: Not on file  Food Insecurity:   . Worried About Programme researcher, broadcasting/film/video in the Last Year: Not on file  . Ran Out of Food in the Last Year: Not on file  Transportation Needs:   . Lack of Transportation (Medical): Not on file  . Lack of Transportation (Non-Medical): Not on file  Physical Activity:   . Days of Exercise per Week: Not on file  . Minutes of Exercise per Session: Not on file  Stress:   . Feeling of Stress : Not on file  Social Connections:   . Frequency of Communication with Friends and Family: Not on file  . Frequency of Social Gatherings with  Friends and Family: Not on file  . Attends Religious Services: Not on file  . Active Member of Clubs or Organizations: Not on file  . Attends Banker Meetings: Not on file  . Marital Status: Not on file  Intimate Partner Violence:   . Fear of Current or Ex-Partner: Not on file  . Emotionally Abused: Not on file  . Physically Abused: Not on file  . Sexually Abused: Not on file    Family History  Problem Relation Age of Onset  . Cancer Mother   . CAD Neg Hx      Review of Systems: [y] = yes, [ ]  = no       General: Weight gain [] ; Weight loss [ ] ; Anorexia [ ] ; Fatigue [ ] ; Fever [ ] ; Chills [ ] ; Weakness [ ]   Cardiac: Chest pain/pressure [x ]; Resting SOB [ ] ; Exertional SOB [ ] ; Orthopnea [ ] ; Pedal Edema [ ] ; Palpitations [ ] ; Syncope [ ] ; Presyncope [ ] ; Paroxysmal nocturnal dyspnea[ ]     Pulmonary: Cough [ ] ; Wheezing[ ] ; Hemoptysis[ ] ; Sputum [ ] ; Snoring [ ]     GI: Vomiting[ ] ; Dysphagia[ ] ; Melena[ ] ; Hematochezia [ ] ; Heartburn[ ] ; Abdominal pain [ ] ; Constipation [ ] ; Diarrhea [ ] ; BRBPR [ ]     GU: Hematuria[ ] ; Dysuria [ ] ; Nocturia[ ]   Vascular: Pain in legs with walking [ ] ; Pain in feet with lying flat [ ] ; Non-healing sores [ ] ; Stroke [ ] ; TIA [ ] ; Slurred speech [ ] ;    Neuro: Headaches[ ] ; Vertigo[ ] ; Seizures[ ] ; Paresthesias[ ] ;Blurred vision [ ] ; Diplopia [ ] ; Vision changes [ ]     Ortho/Skin: Arthritis [ ] ; Joint pain [ ] ; Muscle pain [ ] ; Joint swelling [ ] ; Back Pain [x ]; Rash [ ]     Psych: Depression[ ] ; Anxiety[ ]     Heme: Bleeding problems [ ] ; Clotting disorders [ ] ; Anemia [ ]     Endocrine: Diabetes [ ] ; Thyroid dysfunction[ ]   Physical Exam: Blood pressure (!) 166/95, pulse 73, temperature 98.1 F (36.7 C), temperature source Oral, resp. rate 16, height 5\' 10"  (1.778 m), weight 115.3 kg, SpO2 100 %.   GENERAL: Patient is afebrile, Vital signs reviewed, Well appearing, Patient appears comfortable, Alert and lucid. EYES: Normal  inspection. HEENT:  normocephalic, atraumatic , normal ENT inspection. ORAL:  Moist NECK:  supple , normal inspection. CARD:  regular rate and rhythm, heart sounds normal. RESP:  no respiratory distress, breath sounds normal. ABD: soft, non tender to palpation , BS present, soft, no organomegaly or masses . BACK: non-tender. No CVA tenderness. MUSC:  normal ROM, non-tender , no pedal edema . SKIN: color normal, no rash, warm, dry . NEURO: awake & alert, lucid, no motor/sensory deficit. Gait stable. PSYCH: mood/affect normal.   Labs: Lab Results  Component Value Date   BUN 10 09/19/2020   Lab Results  Component Value Date   CREATININE 0.79 09/19/2020   Lab Results  Component Value Date   NA 132 (L) 09/19/2020   K 3.6 09/19/2020   CL 98 09/19/2020   CO2 27 09/19/2020   No results found for: TROPONINI Lab Results  Component Value Date   WBC 10.2 09/19/2020   HGB 14.8 09/19/2020   HCT 43.3 09/19/2020   MCV 96.2 09/19/2020   PLT 268 09/19/2020   No results found for: CHOL, HDL, LDLCALC, LDLDIRECT, TRIG, CHOLHDL Lab Results  Component Value Date   ALT 16 01/25/2020   AST 15 01/25/2020   ALKPHOS 85 01/25/2020   BILITOT 0.3 01/25/2020      Radiology:  CXR:   IMPRESSION: Mildly prominent interstitial markings bilaterally are similar to prior and may reflect bronchitic type lung changes. No focal airspace consolidation.      Signed: 09/20/2020, 1:10 AM

## 2020-09-20 NOTE — H&P (View-Only) (Signed)
Progress Note  Patient Name: Preston Ortiz Date of Encounter: 09/20/2020  [facility] Cardiologist: No primary care provider on file.   Subjective   Mild chest discomfort this morning.   Inpatient Medications    Scheduled Meds: . aspirin  81 mg Oral Pre-Cath  . aspirin EC  81 mg Oral Daily  . atorvastatin  80 mg Oral Daily  . metoprolol tartrate  25 mg Oral BID   Continuous Infusions: . sodium chloride     Followed by  . sodium chloride    . heparin 1,500 Units/hr (09/20/20 0338)   PRN Meds:    Vital Signs    Vitals:   09/19/20 2230 09/20/20 0000 09/20/20 0019 09/20/20 0339  BP: (!) 142/100 (!) 166/95  118/80  Pulse: 75 73  66  Resp: 16 16  16   Temp:  98.1 F (36.7 C)  98.5 F (36.9 C)  TempSrc:  Oral  Oral  SpO2: 100% 100%  100%  Weight:   115.3 kg   Height:   5\' 10"  (1.778 m)    No intake or output data in the 24 hours ending 09/20/20 0724 Last 3 Weights 09/20/2020 09/19/2020 05/16/2020  Weight (lbs) 254 lb 3.2 oz 260 lb 259 lb 6 oz  Weight (kg) 115.304 kg 117.935 kg 117.652 kg      Telemetry    SR with brief NSVT, PVCs - Personally Reviewed  ECG    SR with RBBB, LAFB - Personally Reviewed  Physical Exam  Pleasant older male, sitting on the side of the bed GEN: No acute distress.   Neck: No JVD Cardiac: RRR, no murmurs, rubs, or gallops.  Respiratory: Clear to auscultation bilaterally. GI: Soft, nontender, non-distended  MS: No edema; No deformity. Neuro:  Nonfocal  Psych: Normal affect   Labs    High Sensitivity Troponin:   Recent Labs  Lab 09/19/20 1712 09/20/20 0207  TROPONINIHS 8,998* 23,389*      Chemistry Recent Labs  Lab 09/19/20 1712 09/20/20 0207  NA 132* 134*  K 3.6 3.6  CL 98 99  CO2 27 22  GLUCOSE 147* 143*  BUN 10 9  CREATININE 0.79 0.81  CALCIUM 8.9 8.8*  GFRNONAA >60 >60  ANIONGAP 7 13     Hematology Recent Labs  Lab 09/19/20 1712 09/20/20 0207  WBC 10.2 10.7*  RBC 4.50 4.12*  HGB 14.8  13.8  HCT 43.3 39.1  MCV 96.2 94.9  MCH 32.9 33.5  MCHC 34.2 35.3  RDW 12.7 12.9  PLT 268 293    BNPNo results for input(s): BNP, PROBNP in the last 168 hours.   DDimer No results for input(s): DDIMER in the last 168 hours.   Radiology    DG Chest Portable 1 View  Result Date: 09/19/2020 CLINICAL DATA:  Cough, chest pain EXAM: PORTABLE CHEST 1 VIEW COMPARISON:  05/13/2020 FINDINGS: The heart size and mediastinal contours are within normal limits. Mildly prominent interstitial markings bilaterally are similar to prior. No focal airspace consolidation, pleural effusion, or pneumothorax. Lower cervical ACDF hardware. Degenerative changes of the bilateral AC joints. IMPRESSION: Mildly prominent interstitial markings bilaterally are similar to prior and may reflect bronchitic type lung changes. No focal airspace consolidation. Electronically Signed   By: 09/21/2020 D.O.   On: 09/19/2020 16:29    Cardiac Studies   N/a   Patient Profile     64 y.o. male with PMH of tobacco use, COPD, cervical fusion, HTN who presented with chest pain and found  to have a NSTEMI.   Assessment & Plan    1. N[place]I: [place] [address] this morning. Had chest pain throughout the evening but was able to fall asleep. Mild chest discomfort this morning. Remains on IV heparin. Will plan for early case this morning.  -- The patient understands that risks included but are not limited to stroke (1 in 1000), death (1 in 1000), kidney failure [usually temporary] (1 in 500), bleeding (1 in 200), allergic reaction [possibly serious] (1 in 200).  -- continue on IV heparin, ASA, statin and BB -- echo pending  2. HTN: has not been consistent with taking his home medications prior to admission. BP improved with the addition of BB therapy  3. NSVT: brief episode noted on telemetry. Continue BB  4. Hx of tobacco use: reports quitting back in July  5. HLD: LDL 118, started on high dose statin on admission  For  questions or updates, please contact [facility] Please consult [email or web address] for contact info under   Signed, Laverda Page, NP  09/20/2020, 7:24 AM

## 2020-09-20 NOTE — Progress Notes (Signed)
°  Echocardiogram 2D Echocardiogram has been performed.  Celene Skeen 09/20/2020, 11:51 AM

## 2020-09-20 NOTE — Progress Notes (Signed)
CRITICAL VALUE ALERT  Critical Value:  Troponin 23,389  Date & Time Notied:  09/20/20 @ 0340  Provider Notified: Lily Peer, MD  Orders Received/Actions taken: Awaiting orders

## 2020-09-20 NOTE — Progress Notes (Signed)
ANTICOAGULATION CONSULT NOTE - Follow Up Consult  Pharmacy Consult for heparin Indication: NSTEMI  Labs: Recent Labs    09/19/20 1712 09/20/20 0207  HGB 14.8 13.8  HCT 43.3 39.1  PLT 268 293  LABPROT 12.6  --   INR 1.0  --   HEPARINUNFRC  --  0.28*  CREATININE 0.79  --   TROPONINIHS 8,998*  --     Assessment: 64yo male subtherapeutic on heparin with initial dosing for NSTEMI; no gtt issues or signs of bleeding per RN.  Goal of Therapy:  Heparin level 0.3-0.7 units/ml   Plan:  Will increase heparin gtt by 1 unit/kg/hr to 1500 units/hr and check level in 6 hours.    Vernard Gambles, PharmD, BCPS  09/20/2020,3:10 AM

## 2020-09-21 DIAGNOSIS — E785 Hyperlipidemia, unspecified: Secondary | ICD-10-CM

## 2020-09-21 LAB — CBC
HCT: 37.3 % — ABNORMAL LOW (ref 39.0–52.0)
Hemoglobin: 12.6 g/dL — ABNORMAL LOW (ref 13.0–17.0)
MCH: 32.9 pg (ref 26.0–34.0)
MCHC: 33.8 g/dL (ref 30.0–36.0)
MCV: 97.4 fL (ref 80.0–100.0)
Platelets: 260 10*3/uL (ref 150–400)
RBC: 3.83 MIL/uL — ABNORMAL LOW (ref 4.22–5.81)
RDW: 13.2 % (ref 11.5–15.5)
WBC: 8.4 10*3/uL (ref 4.0–10.5)
nRBC: 0 % (ref 0.0–0.2)

## 2020-09-21 LAB — BASIC METABOLIC PANEL
Anion gap: 7 (ref 5–15)
BUN: 15 mg/dL (ref 8–23)
CO2: 26 mmol/L (ref 22–32)
Calcium: 8.5 mg/dL — ABNORMAL LOW (ref 8.9–10.3)
Chloride: 102 mmol/L (ref 98–111)
Creatinine, Ser: 0.93 mg/dL (ref 0.61–1.24)
GFR, Estimated: >60 mL/min (ref >60)
Glucose, Bld: 103 mg/dL — ABNORMAL HIGH (ref 70–99)
Potassium: 4.5 mmol/L (ref 3.5–5.1)
Sodium: 135 mmol/L (ref 135–145)

## 2020-09-21 MED ORDER — METOPROLOL TARTRATE 25 MG PO TABS
25.0000 mg | ORAL_TABLET | Freq: Two times a day (BID) | ORAL | 3 refills | Status: DC
Start: 2020-09-21 — End: 2021-09-17

## 2020-09-21 MED ORDER — ATORVASTATIN CALCIUM 80 MG PO TABS
80.0000 mg | ORAL_TABLET | Freq: Every day | ORAL | 3 refills | Status: DC
Start: 2020-09-22 — End: 2020-10-04

## 2020-09-21 MED ORDER — TICAGRELOR 90 MG PO TABS
90.0000 mg | ORAL_TABLET | Freq: Two times a day (BID) | ORAL | 3 refills | Status: DC
Start: 2020-09-21 — End: 2020-09-25

## 2020-09-21 MED ORDER — ASPIRIN 81 MG PO TBEC: 81.0000 mg | DELAYED_RELEASE_TABLET | Freq: Every day | ORAL | 11 refills | Status: AC

## 2020-09-21 NOTE — TOC Transition Note (Signed)
Transition of Care [facility]) - CM/SW Discharge Note   Patient Details  Name: Preston Ortiz MRN: [id number] Date of Birth: 02-Apr-1956  [facility]) CM/SW Contact:  Lawerance Sabal, RN Phone Number: 09/21/2020, 11:32 AM   Clinical Narrative:   Sherron Monday w patient at bedside. Discussed PCP, [facility]. Brilinta card given and explained. He will discuss refills, samples, patient assistance or alternatives at follow up apt, scheduled on AVS. No other CM needs identified.     Final next level of care: Home/Self Care Barriers to Discharge: No Barriers Identified   Patient Goals and CMS Choice        Discharge Placement                       Discharge Plan and Services                                     Social Determinants of Health (SDOH) Interventions     Readmission Risk Interventions No flowsheet data found.

## 2020-09-21 NOTE — Plan of Care (Signed)
  Problem: Education: Goal: Understanding of CV disease, CV risk reduction, and recovery process will improve Outcome: Progressing   Problem: Activity: Goal: Ability to return to baseline activity level will improve Outcome: Progressing   Problem: Cardiovascular: Goal: Ability to achieve and maintain adequate cardiovascular perfusion will improve Outcome: Progressing   

## 2020-09-21 NOTE — Discharge Summary (Addendum)
Discharge Summary    Patient ID: COBE VINEY MRN: [id number]; DOB: [birth year]  Admit date: 09/19/2020 Discharge date: 09/21/2020  Primary Care Provider: Patient, No Pcp Per  Primary Cardiologist: Peter Swaziland, MD  Primary Electrophysiologist:  None   Discharge Diagnoses    Principal Problem:   NSTEMI (non-ST elevated myocardial infarction) [facility]) Active Problems:   Essential hypertension   Hyperlipidemia with target LDL less than 70    Diagnostic Studies/Procedures    Left heart cath 09/20/20: Mid LAD to Dist LAD lesion is 30% stenosed. Prox RCA to Mid RCA lesion is 100% stenosed. 1st RPL lesion is 100% stenosed. Post intervention, there is a 0% residual stenosis. A drug-eluting stent was successfully placed using a STENT RESOLUTE ONYX 3.0X26. The left ventricular systolic function is normal. LV end diastolic pressure is normal. The left ventricular ejection fraction is 55-65% by visual estimate.   1. 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 2. Normal LV function 3. Normal LVEDP 4. Successful PCI of the proximal RCA with DES x1.    Plan: DAPT for one year. Anticipate DC tomorrow.   _____________   Echo 09/20/20: 1. Left ventricular ejection fraction, by estimation, is 50 to 55%. The  left ventricle has low normal function. The left ventricle demonstrates  regional wall motion abnormalities (see scoring diagram/findings for  description). Left ventricular diastolic   parameters are consistent with Grade I diastolic dysfunction (impaired  relaxation). There is moderate hypokinesis of the left ventricular, basal  inferoseptal wall, inferior wall and inferolateral wall.   2. Right ventricular systolic function is normal. The right ventricular  size is normal.   3. The mitral valve is normal in structure. No evidence of mitral valve  regurgitation. No evidence of mitral stenosis.   4. The aortic valve is normal in structure.  Aortic valve regurgitation is  not visualized. No aortic stenosis is present.   5. The inferior vena cava is dilated in size with <50% respiratory  variability, suggesting right atrial pressure of 15 mmHg.    History of Present Illness     Preston Ortiz is a 64 y.o. 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 Eased off with SL Nitro and aspirin but stayed for a while 3/10 and is now slowly improving but stable. He denies any other symptoms- sob, heart failure symptoms, dizzy, light headed, n, v, recent covid symptoms   OSH: EKG- nsr no new ischemic changes, trop 8998-> gave aspirin, nitro and started on heparin gtt and transferred to [facility] for further eval. Pt was HTNsive throughout.  Quit smoking in July (significant smoking history). Family h/o largely unknown, denies other drug use. Able to do ADLS, currently not working due to cervical fusion/ djd, back issues.   Hospital Course     Consultants: none  NSTEMI CAD Patient was transferred to [facility] for further management. ASA, BB, and 80 mg lipitor started. Troponin peaked at >26000. Heart catheterization. LHC showed proximal mid RCA 100% stenosis treated with DES. RPL-1 with 100% stenosis with collateral, Annajulia Lewing manage medically. Echocardiogram showed normal EF with grade 1 DD and no significant valvular disease. Started on DAPT with ASA  and brilinta. Continue lopressor 25 mg BID and 80 mg lipitor. Cath site C/D/I.   Hypertension Continue BB as above. Pressures borderline high prior to medications. Monitor and titrate medications as needed outpatient.     Hyperlipidemia with LDL goal < 70 09/20/2020: Cholesterol 183; HDL 41; LDL Cholesterol 118;  Triglycerides 122; VLDL 24 80 mg lipitor initiated this hospitalization - Fardowsa Authier need to check lipids and LFTs in 6 weeks.    Pt seen and examined by Dr. Elberta Fortisamnitz and felt stable for discharge. I have arranged cardiology follow up.     Did the patient have an acute coronary syndrome (MI, NSTEMI, STEMI, etc) this admission?:  Yes                               AHA/ACC Clinical Performance & Quality Measures: Aspirin prescribed? - Yes ADP Receptor Inhibitor (Plavix/Clopidogrel, Brilinta/Ticagrelor or Effient/Prasugrel) prescribed (includes medically managed patients)? - Yes Beta Blocker prescribed? - Yes High Intensity Statin (Lipitor 40-80mg  or Crestor 20-40mg ) prescribed? - Yes EF assessed during THIS hospitalization? - Yes For EF <40%, was ACEI/ARB prescribed? - Not Applicable (EF >/= 40%) For EF <40%, Aldosterone Antagonist (Spironolactone or Eplerenone) prescribed? - Not Applicable (EF >/= 40%) Cardiac Rehab Phase II ordered (including medically managed patients)? - Yes       _____________  Discharge Vitals Blood pressure (!) 148/92, pulse 76, temperature 97.6 F (36.4 C), temperature source Oral, resp. rate 18, height 5\' 10"  (1.778 m), weight 115.3 kg, SpO2 98 %.  Filed Weights   09/19/20 1537 09/20/20 0019  Weight: 117.9 kg 115.3 kg    Labs & Radiologic Studies    CBC Recent Labs    09/20/20 0207 09/21/20 0348  WBC 10.7* 8.4  HGB 13.8 12.6*  HCT 39.1 37.3*  MCV 94.9 97.4  PLT 293 260   Basic Metabolic Panel Recent Labs    16/07/9610/26/21 0207 09/21/20 0348  NA 134* 135  K 3.6 4.5  CL 99 102  CO2 22 26  GLUCOSE 143* 103*  BUN 9 15  CREATININE 0.81 0.93  CALCIUM 8.8* 8.5*  MG 2.1  --    Liver Function Tests No results for input(s): AST, ALT, ALKPHOS, BILITOT, PROT, ALBUMIN in the last 72 hours. No results for input(s): LIPASE, AMYLASE in the last 72 hours. High Sensitivity Troponin:   Recent Labs  Lab 09/19/20 1712 09/20/20 0207  TROPONINIHS 8,998* 23,389*     BNP Invalid input(s): POCBNP D-Dimer No results for input(s): DDIMER in the last 72 hours. Hemoglobin A1C Recent Labs    09/20/20 0207  HGBA1C 5.6   Fasting Lipid Panel Recent Labs    09/20/20 0207  CHOL 183  HDL 41  LDLCALC 118*  TRIG 122  CHOLHDL 4.5   Thyroid Function Tests No results for input(s): TSH, T4TOTAL, T3FREE, THYROIDAB in the last 72 hours.  Invalid input(s): FREET3 _____________  CARDIAC CATHETERIZATION  Result Date: 09/20/2020  Mid LAD to Dist LAD lesion is 30% stenosed.  Prox RCA to Mid RCA lesion is 100% stenosed.  1st RPL lesion is 100% stenosed.  Post intervention, there is a 0% residual stenosis.  A drug-eluting stent was successfully placed using a STENT RESOLUTE ONYX 3.0X26.  The left ventricular systolic function is normal.  LV end diastolic pressure is normal.  The left ventricular ejection fraction is 55-65% by visual estimate.  1. 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 2. Normal  LV function 3. Normal LVEDP 4. Successful PCI of the proximal RCA with DES x1. Plan: DAPT for one year. Anticipate DC tomorrow.   DG Chest Portable 1 View  Result Date: 09/19/2020 CLINICAL DATA:  Cough, chest pain EXAM: PORTABLE CHEST 1 VIEW COMPARISON:  05/13/2020 FINDINGS: The heart size and mediastinal contours are within normal limits. Mildly prominent interstitial markings bilaterally are similar to prior. No focal airspace consolidation, pleural effusion, or pneumothorax. Lower cervical ACDF hardware. Degenerative changes of the bilateral AC joints. IMPRESSION: Mildly prominent interstitial markings bilaterally are similar to prior and may reflect bronchitic type lung changes. No focal airspace consolidation. Electronically Signed   By: Duanne Guess D.O.   On: 09/19/2020 16:29   ECHOCARDIOGRAM COMPLETE  Result Date: 09/20/2020    ECHOCARDIOGRAM REPORT   Patient Name:   ZEDEKIAH HINDERMAN Date of Exam: 09/20/2020 Medical  Rec #:  [id number]          Height:       70.0 in Accession #:    [id number]         Weight:       254.2 lb Date of Birth:  [birth year]         BSA:          2.311 m Patient Age:    64 years           BP:           130/92 mmHg Patient Gender: M                  HR:           68 bpm. Exam Location:  Inpatient Procedure: 2D Echo Indications:    I21.4 NSTEMI  History:        Patient has no prior history of Echocardiogram examinations.                 Risk Factors:Current Smoker and Hypertension.  Sonographer:    Celene Skeen RDCS (AE) Referring Phys: [id number] Elmon Kirschner  Sonographer Comments: restricted mobility. unable to turn on left side IMPRESSIONS  1. Left ventricular ejection fraction, by estimation, is 50 to 55%. The left ventricle has low normal function. The left ventricle demonstrates regional wall motion abnormalities (see scoring diagram/findings for description). Left ventricular diastolic  parameters are consistent with Grade I diastolic dysfunction (impaired relaxation). There is moderate hypokinesis of the left ventricular, basal inferoseptal wall, inferior wall and inferolateral wall.  2. Right ventricular systolic function is normal. The right ventricular size is normal.  3. The mitral valve is normal in structure. No evidence of mitral valve regurgitation. No evidence of mitral stenosis.  4. The aortic valve is normal in structure. Aortic valve regurgitation is not visualized. No aortic stenosis is present.  5. The inferior vena cava is dilated in size with <50% respiratory variability, suggesting right atrial pressure of 15 mmHg. FINDINGS  Left Ventricle: Left ventricular ejection fraction, by estimation, is 50 to 55%. The left ventricle has low normal function. The left ventricle demonstrates regional wall motion abnormalities. Moderate hypokinesis of the left ventricular, basal inferoseptal wall, inferior wall and inferolateral wall. Definity contrast agent was given IV to delineate the left  ventricular endocardial borders. The left ventricular internal cavity size was normal in size. There is no left ventricular hypertrophy. Left ventricular diastolic parameters are consistent with Grade I diastolic dysfunction (impaired relaxation). Normal left ventricular filling pressure. Right Ventricle: The right ventricular size is normal. No increase in right  ventricular wall thickness. Right ventricular systolic function is normal. Left Atrium: Left atrial size was normal in size. Right Atrium: Right atrial size was normal in size. Pericardium: There is no evidence of pericardial effusion. Mitral Valve: The mitral valve is normal in structure. No evidence of mitral valve regurgitation. No evidence of mitral valve stenosis. Tricuspid Valve: The tricuspid valve is normal in structure. Tricuspid valve regurgitation is not demonstrated. No evidence of tricuspid stenosis. Aortic Valve: The aortic valve is normal in structure. Aortic valve regurgitation is not visualized. No aortic stenosis is present. Pulmonic Valve: The pulmonic valve was normal in structure. Pulmonic valve regurgitation is not visualized. No evidence of pulmonic stenosis. Aorta: The aortic root is normal in size and structure. Venous: The inferior vena cava is dilated in size with less than 50% respiratory variability, suggesting right atrial pressure of 15 mmHg. IAS/Shunts: No atrial level shunt detected by color flow Doppler.  LEFT VENTRICLE PLAX 2D LVIDd:         4.20 cm  Diastology LVIDs:         3.10 cm  LV e' medial:    6.53 cm/s LV PW:         1.20 cm  LV E/e' medial:  5.5 LV IVS:        0.80 cm  LV e' lateral:   7.18 cm/s LVOT diam:     1.80 cm  LV E/e' lateral: 5.0 LV SV:         49 LV SV Index:   21 LVOT Area:     2.54 cm  RIGHT VENTRICLE TAPSE (M-mode): 1.5 cm LEFT ATRIUM         Index LA diam:    3.40 cm 1.47 cm/m  AORTIC VALVE LVOT Vmax:   116.00 cm/s LVOT Vmean:  84.100 cm/s LVOT VTI:    0.191 m  AORTA Ao Root diam: 2.70 cm MITRAL  VALVE MV Area (PHT): 2.91 cm    SHUNTS MV Decel Time: 261 msec    Systemic VTI:  0.19 m MV E velocity: 36.00 cm/s  Systemic Diam: 1.80 cm MV A velocity: 54.00 cm/s MV E/A ratio:  0.67 Mihai Croitoru MD Electronically signed by Thurmon Fair MD Signature Date/Time: 09/20/2020/1:03:20 PM    Final    Disposition   Pt is being discharged home today in good condition.  Follow-up Plans & Appointments     Follow-up Information     [place], Peter M, MD Follow up on 09/25/2020.   Specialty: Cardiology Why: 8:00 am Contact information: [address] [place] [address] [phone]                Discharge Instructions     Amb Referral to Cardiac Rehabilitation   Complete by: As directed    Diagnosis:  Coronary Stents NSTEMI     After initial evaluation and assessments completed: Virtual Based Care may be provided alone or in conjunction with Phase 2 Cardiac Rehab based on patient barriers.: Yes   Diet - low sodium heart healthy   Complete by: As directed    Discharge instructions   Complete by: As directed    No driving for 2 days. No lifting over 5 lbs for 1 week. No sexual activity for 1 week. You may return to work in 1 week. Keep procedure site clean & dry. If you notice increased pain, swelling, bleeding or pus, call/return!  You may shower, but no soaking baths/hot tubs/pools for 1 week.   Increase  activity slowly   Complete by: As directed        Discharge Medications   Allergies as of 09/21/2020       Reactions   Cyclobenzaprine    Blurred vision    Gabapentin    Bloody stools    Penicillins    childhood allergy  Has patient had a PCN reaction causing immediate rash, facial/tongue/throat swelling, SOB or lightheadedness with hypotension: Unknown Has patient had a PCN reaction causing severe rash involving mucus membranes or skin necrosis: Unknown Has patient had a PCN reaction that required hospitalization Unknown Has patient had a PCN  reaction occurring within the last 10 years: No If all of the above answers are "NO", then may proceed with Cephalosporin use.        Medication List     STOP taking these medications    amLODipine 10 MG tablet Commonly known as: NORVASC   celecoxib 200 MG capsule Commonly known as: CELEBREX       TAKE these medications    aspirin 81 MG EC tablet Take 1 tablet (81 mg total) by mouth daily. Swallow whole. Start taking on: September 22, 2020   atorvastatin 80 MG tablet Commonly known as: LIPITOR Take 1 tablet (80 mg total) by mouth daily. Start taking on: September 22, 2020   metoprolol tartrate 25 MG tablet Commonly known as: LOPRESSOR Take 1 tablet (25 mg total) by mouth 2 (two) times daily.   ondansetron 4 MG tablet Commonly known as: Zofran Take 1 tablet (4 mg total) by mouth every 8 (eight) hours as needed for nausea or vomiting.   ticagrelor 90 MG Tabs tablet Commonly known as: BRILINTA Take 1 tablet (90 mg total) by mouth 2 (two) times daily.           Outstanding Labs/Studies   Fasting lipids and LFTs in 6 weeks  Duration of Discharge Encounter   Greater than 30 minutes including physician time.  Signed, Roe Rutherford Duke, PA 09/21/2020, 11:03 AM    I have seen and examined this patient with Angie Duke.  Agree with above, note added to reflect my findings.  On exam, RRR, no murmurs, lungs clear. Admitted with NSTEMI post DES to the RCA. Plan for discharge today with follow up in clinic.    Harla Mensch M. Alika Saladin MD 09/21/2020 11:11 AM

## 2020-09-21 NOTE — Progress Notes (Signed)
Progress Note  Patient Name: Preston Ortiz Date of Encounter: 09/21/2020  [facility] Cardiologist: No primary care provider on file.   Subjective   Feeling well today.  No further chest pain since his left heart catheterization and stenting of the RCA.  Inpatient Medications    Scheduled Meds: . aspirin EC  81 mg Oral Daily  . atorvastatin  80 mg Oral Daily  . enoxaparin (LOVENOX) injection  40 mg Subcutaneous Q24H  . metoprolol tartrate  25 mg Oral BID  . sodium chloride flush  3 mL Intravenous Q12H  . ticagrelor  90 mg Oral BID   Continuous Infusions: . sodium chloride     PRN Meds:    Vital Signs    Vitals:   09/20/20 2046 09/20/20 2152 09/21/20 0500 09/21/20 0854  BP: 134/89 108/81 (!) 143/88 (!) 148/92  Pulse: 72 79 63 76  Resp: 16  18 18   Temp: 99.1 F (37.3 C)  98.3 F (36.8 C) 97.6 F (36.4 C)  TempSrc: Oral  Oral Oral  SpO2: 99%  98%   Weight:      Height:        Intake/Output Summary (Last 24 hours) at 09/21/2020 1027 Last data filed at 09/20/2020 1600 Gross per 24 hour  Intake 415.08 ml  Output --  Net 415.08 ml   Last 3 Weights 09/20/2020 09/19/2020 05/16/2020  Weight (lbs) 254 lb 3.2 oz 260 lb 259 lb 6 oz  Weight (kg) 115.304 kg 117.935 kg 117.652 kg      Telemetry    Sinus rhythm- Personally Reviewed  ECG    Sinus rhythm, right bundle branch block, left anterior fascicular block- Personally Reviewed  Physical Exam   GEN: Well nourished, well developed, in no acute distress  HEENT: normal  Neck: no JVD, carotid bruits, or masses Cardiac: RRR; no murmurs, rubs, or gallops,no edema  Respiratory:  clear to auscultation bilaterally, normal work of breathing GI: soft, nontender, nondistended, + BS MS: no deformity or atrophy  Skin: warm and dry Neuro:  Strength and sensation are intact Psych: euthymic mood, full affect    Labs    High Sensitivity Troponin:   Recent Labs  Lab 09/19/20 1712 09/20/20 0207   TROPONINIHS 8,998* 23,389*      Chemistry Recent Labs  Lab 09/19/20 1712 09/20/20 0207 09/21/20 0348  NA 132* 134* 135  K 3.6 3.6 4.5  CL 98 99 102  CO2 27 22 26   GLUCOSE 147* 143* 103*  BUN 10 9 15   CREATININE 0.79 0.81 0.93  CALCIUM 8.9 8.8* 8.5*  GFRNONAA >60 >60 >60  ANIONGAP 7 13 7      Hematology Recent Labs  Lab 09/19/20 1712 09/20/20 0207 09/21/20 0348  WBC 10.2 10.7* 8.4  RBC 4.50 4.12* 3.83*  HGB 14.8 13.8 12.6*  HCT 43.3 39.1 37.3*  MCV 96.2 94.9 97.4  MCH 32.9 33.5 32.9  MCHC 34.2 35.3 33.8  RDW 12.7 12.9 13.2  PLT 268 293 260    BNPNo results for input(s): BNP, PROBNP in the last 168 hours.   DDimer No results for input(s): DDIMER in the last 168 hours.   Radiology    CARDIAC CATHETERIZATION  Result Date: 09/20/2020  Mid LAD to Dist LAD lesion is 30% stenosed.  Prox RCA to Mid RCA lesion is 100% stenosed.  1st RPL lesion is 100% stenosed.  Post intervention, there is a 0% residual stenosis.  A drug-eluting stent was successfully placed using a STENT RESOLUTE ONYX  [id number].  The left ventricular systolic function is normal.  LV end diastolic pressure is normal.  The left ventricular ejection fraction is 55-65% by visual estimate.  1. 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 2. Normal LV function 3. Normal LVEDP 4. Successful PCI of the proximal RCA with DES x1. Plan: DAPT for one year. Anticipate DC tomorrow.   DG Chest Portable 1 View  Result Date: 09/19/2020 CLINICAL DATA:  Cough, chest pain EXAM: PORTABLE CHEST 1 VIEW COMPARISON:  05/13/2020 FINDINGS: The heart size and mediastinal contours are within normal limits. Mildly prominent interstitial markings bilaterally are similar to prior. No focal airspace consolidation, pleural effusion, or pneumothorax. Lower cervical ACDF hardware. Degenerative changes of the bilateral AC joints. IMPRESSION: Mildly prominent interstitial markings bilaterally are similar to  prior and may reflect bronchitic type lung changes. No focal airspace consolidation. Electronically Signed   By: Duanne Guess D.O.   On: 09/19/2020 16:29   ECHOCARDIOGRAM COMPLETE  Result Date: 09/20/2020    ECHOCARDIOGRAM REPORT   Patient Name:   Preston Ortiz Date of Exam: 09/20/2020 Medical Rec #:  [id number]          Height:       70.0 in Accession #:    [id number]         Weight:       254.2 lb Date of Birth:  16-May-1956         BSA:          2.311 m Patient Age:    64 years           BP:           130/92 mmHg Patient Gender: M                  HR:           68 bpm. Exam Location:  Inpatient Procedure: 2D Echo Indications:    I21.4 NSTEMI  History:        Patient has no prior history of Echocardiogram examinations.                 Risk Factors:Current Smoker and Hypertension.  Sonographer:    Celene Skeen RDCS (AE) Referring Phys: [id number] Elmon Kirschner  Sonographer Comments: restricted mobility. unable to turn on left side IMPRESSIONS  1. Left ventricular ejection fraction, by estimation, is 50 to 55%. The left ventricle has low normal function. The left ventricle demonstrates regional wall motion abnormalities (see scoring diagram/findings for description). Left ventricular diastolic  parameters are consistent with Grade I diastolic dysfunction (impaired relaxation). There is moderate hypokinesis of the left ventricular, basal inferoseptal wall, inferior wall and inferolateral wall.  2. Right ventricular systolic function is normal. The right ventricular size is normal.  3. The mitral valve is normal in structure. No evidence of mitral valve regurgitation. No evidence of mitral stenosis.  4. The aortic valve is normal in structure. Aortic valve regurgitation is not visualized. No aortic stenosis is present.  5. The inferior vena cava is dilated in size with <50% respiratory variability, suggesting right atrial pressure of 15 mmHg. FINDINGS  Left Ventricle: Left ventricular ejection fraction,  by estimation, is 50 to 55%. The left ventricle has low normal function. The left ventricle demonstrates regional wall motion abnormalities. Moderate hypokinesis of the left ventricular, basal inferoseptal wall, inferior wall and inferolateral wall. Definity contrast agent was given IV to delineate the left ventricular endocardial borders. The left ventricular internal cavity  size was normal in size. There is no left ventricular hypertrophy. Left ventricular diastolic parameters are consistent with Grade I diastolic dysfunction (impaired relaxation). Normal left ventricular filling pressure. Right Ventricle: The right ventricular size is normal. No increase in right ventricular wall thickness. Right ventricular systolic function is normal. Left Atrium: Left atrial size was normal in size. Right Atrium: Right atrial size was normal in size. Pericardium: There is no evidence of pericardial effusion. Mitral Valve: The mitral valve is normal in structure. No evidence of mitral valve regurgitation. No evidence of mitral valve stenosis. Tricuspid Valve: The tricuspid valve is normal in structure. Tricuspid valve regurgitation is not demonstrated. No evidence of tricuspid stenosis. Aortic Valve: The aortic valve is normal in structure. Aortic valve regurgitation is not visualized. No aortic stenosis is present. Pulmonic Valve: The pulmonic valve was normal in structure. Pulmonic valve regurgitation is not visualized. No evidence of pulmonic stenosis. Aorta: The aortic root is normal in size and structure. Venous: The inferior vena cava is dilated in size with less than 50% respiratory variability, suggesting right atrial pressure of 15 mmHg. IAS/Shunts: No atrial level shunt detected by color flow Doppler.  LEFT VENTRICLE PLAX 2D LVIDd:         4.20 cm  Diastology LVIDs:         3.10 cm  LV e' medial:    6.53 cm/s LV PW:         1.20 cm  LV E/e' medial:  5.5 LV IVS:        0.80 cm  LV e' lateral:   7.18 cm/s LVOT diam:      1.80 cm  LV E/e' lateral: 5.0 LV SV:         49 LV SV Index:   21 LVOT Area:     2.54 cm  RIGHT VENTRICLE TAPSE (M-mode): 1.5 cm LEFT ATRIUM         Index LA diam:    3.40 cm 1.47 cm/m  AORTIC VALVE LVOT Vmax:   116.00 cm/s LVOT Vmean:  84.100 cm/s LVOT VTI:    0.191 m  AORTA Ao Root diam: 2.70 cm MITRAL VALVE MV Area (PHT): 2.91 cm    SHUNTS MV Decel Time: 261 msec    Systemic VTI:  0.19 m MV E velocity: 36.00 cm/s  Systemic Diam: 1.80 cm MV A velocity: 54.00 cm/s MV E/A ratio:  0.67 Mihai Croitoru MD Electronically signed by Thurmon Fair MD Signature Date/Time: 09/20/2020/1:03:20 PM    Final     Cardiac Studies   N/a   Patient Profile     64 y.o. male with PMH of tobacco use, COPD, cervical fusion, HTN who presented with chest pain and found to have a NSTEMI.   Assessment & Plan    1. NSTEMI: High-sensitivity troponin to 23 389 yesterday morning.  Had chest pain throughout the evening.  Left heart catheterization showed an RCA 100% stenosis and is now status post stenting.  No current chest pain.  Plan to discharge on both aspirin and ticagrelor.   2. HTN: Blood pressure elevated today.  We Eilene Voigt continue his beta-blocker and start him on 50 mg of losartan.  3. NSVT: Continue beta-blocker  4. Hx of tobacco use: Quit in July  5. HLD: LDL 118.  Has been started on high-dose statin.  Patient doing well post catheterization.  We Danean Marner plan for discharge today.  For questions or updates, please contact [facility] Please consult [email or web address] for contact info under  Signed, Amie Cowens Jorja LoaMartin Josemaria Brining, MD  09/21/2020, 10:27 AM

## 2020-09-21 NOTE — Progress Notes (Signed)
CARDIAC REHAB PHASE I   PRE:  Rate/Rhythm: Sinus Rhythm 70  BP:    Sitting: 138/82     SaO2: 98%  MODE:  Ambulation: 600 ft   POST:  Rate/Rhythem: 80  BP:   Sitting: 123/89     SaO2: 100% 0755-0900 Doug ambulated in the hallway independently without complaints or chest pain. Reviewed MI booklet, exercise instructions reviewed.Discussed temperature precautions end points of exercise. Will refer to [facility]. Patient needs case management referral as he does not have health insurance. Patient's RN notified.Reviewed heart health diet, risk factors. Talked to the patient about when to call 911. Patient has stent card. Also discussed the importance of taking medications as prescribed.   Thayer Headings RN BSN

## 2020-09-23 ENCOUNTER — Encounter (HOSPITAL_COMMUNITY): Payer: Self-pay | Admitting: Cardiology

## 2020-09-23 NOTE — Progress Notes (Signed)
Cardiology Office Note   Date:  09/25/2020   [name], DOB 22-Oct-1956, MRN [id number]  PCP:  Patient, No Pcp Per  Cardiologist:   Fowler Antos [place], MD   Chief Complaint  Patient presents with  . Coronary Artery Disease      History of Present Illness: Preston Ortiz is a 64 y.o. male who presents for post hospital transition of care follow up following a NSTEMI. He has a history of former smoker, COPD, cervical fusion surgery in July. He has a history of HTN and untreated HLD. He presented on Nov 25 with chest pain. Troponin increased to > 23,000. Ecg showed NSR with a RBBB and LAD. No acute ST changes. Heart catheterization showed proximal mid RCA 100% with collaterals. Stenosis treated with DES. Small RPL-1 with 100% stenosis with collateral, managed medically. Echocardiogram showed EF 50-55% with inferobasal HK with grade 1 DD and no significant valvular disease. Started on DAPT with ASA and brilinta. Continue lopressor 25 mg BID and 80 mg lipitor.   Since DC he has had fleeting chest pain. No significant SOB. Notes BP has been persistently high. He has made significant changes in diet and has lost 5 lbs. Notes loose stools/diarrhea 1-2 times daily. He is not smoking.     Past Medical History:  Diagnosis Date  . Arthritis    osteoathritis  . Coronary artery disease    NSTEMI November 2021. DES of RCA  . GERD (gastroesophageal reflux disease)    ocassional  . Hyperlipidemia   . Hypertension     Past Surgical History:  Procedure Laterality Date  . ANTERIOR CERVICAL DECOMP/DISCECTOMY FUSION N/A 05/16/2020   Procedure: ANTERIOR CERVICAL DECOMPRESSION/DISCECTOMY FUSION CERVICAL FIVE THROUGH CERVICAL SEVEN;  Surgeon: Venita Lick, MD;  Location: [facility] OR;  Service: Orthopedics;  Laterality: N/A;  3.5 hrs Needs pre op anesthesia consult-doesn't have a PCP  . CORONARY STENT INTERVENTION N/A 09/20/2020   Procedure: CORONARY STENT INTERVENTION;  Surgeon: [place],  Theodus Ran M, MD;  Location: [facility] INVASIVE CV LAB;  Service: Cardiovascular;  Laterality: N/A;  . HEMATOMA EVACUATION N/A 05/16/2020   Procedure: EVACUATION HEMATOMA;  Surgeon: Venita Lick, MD;  Location: [facility] OR;  Service: Orthopedics;  Laterality: N/A;  . LEFT HEART CATH AND CORONARY ANGIOGRAPHY N/A 09/20/2020   Procedure: LEFT HEART CATH AND CORONARY ANGIOGRAPHY;  Surgeon: [place], Zyheir Daft M, MD;  Location: [facility] INVASIVE CV LAB;  Service: Cardiovascular;  Laterality: N/A;  . TONSILLECTOMY     chilhood  . TOTAL HIP ARTHROPLASTY Right 11/06/2016   Procedure: RIGHT TOTAL HIP ARTHROPLASTY ANTERIOR APPROACH;  Surgeon: Kathryne Hitch, MD;  Location: [facility] ORS;  Service: Orthopedics;  Laterality: Right;     Current Outpatient Medications  Medication Sig Dispense Refill  . aspirin EC 81 MG EC tablet Take 1 tablet (81 mg total) by mouth daily. Swallow whole. 30 tablet 11  . atorvastatin (LIPITOR) 80 MG tablet Take 1 tablet (80 mg total) by mouth daily. 90 tablet 3  . metoprolol tartrate (LOPRESSOR) 25 MG tablet Take 1 tablet (25 mg total) by mouth 2 (two) times daily. 180 tablet 3  . ticagrelor (BRILINTA) 90 MG TABS tablet Take 1 tablet (90 mg total) by mouth 2 (two) times daily. 180 tablet 3  . amLODipine (NORVASC) 5 MG tablet Take 1 tablet (5 mg total) by mouth daily. 90 tablet 3  . clopidogrel (PLAVIX) 75 MG tablet Take 1 tablet (75 mg total) by mouth daily. 90 tablet 3  . ondansetron (  ZOFRAN) 4 MG tablet Take 1 tablet (4 mg total) by mouth every 8 (eight) hours as needed for nausea or vomiting. (Patient not taking: Reported on 09/25/2020) 20 tablet 0   No current facility-administered medications for this visit.    Allergies:   Cyclobenzaprine, Gabapentin, and Penicillins    Social History:  The patient  reports that he quit smoking about 5 months ago. His smoking use included cigarettes. He has a 40.00 pack-year smoking history. He has never used smokeless tobacco. He reports current alcohol use.  He reports that he does not use drugs.   Family History:  The patient's family history includes Cancer in his mother.    ROS:  Please see the history of present illness.   Otherwise, review of systems are positive for none.   All other systems are reviewed and negative.    PHYSICAL EXAM: VS:  BP (!) 150/90 (BP Location: Left Arm, Patient Position: Sitting)   Pulse 61   Ht 5\' 10"  (1.778 m)   Wt 260 lb 6.4 oz (118.1 kg)   SpO2 98%   BMI 37.36 kg/m  , BMI Body mass index is 37.36 kg/m. GEN: Well nourished, well developed, in no acute distress  HEENT: normal  Neck: no JVD, carotid bruits, or masses Cardiac: RRR; no murmurs, rubs, or gallops,no edema. Radial cath site without hematoma. Pulses 2+ Respiratory:  clear to auscultation bilaterally, normal work of breathing GI: soft, nontender, nondistended, + BS MS: no deformity or atrophy  Skin: warm and dry, no rash Neuro:  Strength and sensation are intact Psych: euthymic mood, full affect   EKG:  EKG is ordered today. The ekg ordered today demonstrates NSR rate 56. LAD, RBBB. No change. I have personally reviewed and interpreted this study.    Recent Labs: 01/25/2020: ALT 16 09/20/2020: Magnesium 2.1 09/21/2020: BUN 15; Creatinine, Ser 0.93; Hemoglobin 12.6; Platelets 260; Potassium 4.5; Sodium 135    Lipid Panel    Component Value Date/Time   CHOL 183 09/20/2020 0207   TRIG 122 09/20/2020 0207   HDL 41 09/20/2020 0207   CHOLHDL 4.5 09/20/2020 0207   VLDL 24 09/20/2020 0207   LDLCALC 118 (H) 09/20/2020 0207      Wt Readings from Last 3 Encounters:  09/25/20 260 lb 6.4 oz (118.1 kg)  09/20/20 254 lb 3.2 oz (115.3 kg)  05/16/20 259 lb 6 oz (117.7 kg)      Other studies Reviewed: Additional studies/ records that were reviewed today include:   Cardiac cath 09/20/20:  CORONARY STENT INTERVENTION  LEFT HEART CATH AND CORONARY ANGIOGRAPHY  Conclusion    Mid LAD to Dist LAD lesion is 30% stenosed.  Prox RCA to  Mid RCA lesion is 100% stenosed.  1st RPL lesion is 100% stenosed.  Post intervention, there is a 0% residual stenosis.  A drug-eluting stent was successfully placed using a STENT RESOLUTE ONYX [id number].  The left ventricular systolic function is normal.  LV end diastolic pressure is normal.  The left ventricular ejection fraction is 55-65% by visual estimate.   1. 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 2. Normal LV function 3. Normal LVEDP 4. Successful PCI of the proximal RCA with DES x1.   Echo 09/20/20: IMPRESSIONS    1. Left ventricular ejection fraction, by estimation, is 50 to 55%. The  left ventricle has low normal function. The left ventricle demonstrates  regional wall motion abnormalities (see scoring diagram/findings for  description). Left ventricular diastolic  parameters  are consistent with Grade I diastolic dysfunction (impaired  relaxation). There is moderate hypokinesis of the left ventricular, basal  inferoseptal wall, inferior wall and inferolateral wall.  2. Right ventricular systolic function is normal. The right ventricular  size is normal.  3. The mitral valve is normal in structure. No evidence of mitral valve  regurgitation. No evidence of mitral stenosis.  4. The aortic valve is normal in structure. Aortic valve regurgitation is  not visualized. No aortic stenosis is present.  5. The inferior vena cava is dilated in size with <50% respiratory  variability, suggesting right atrial pressure of 15 mmHg.   ASSESSMENT AND PLAN:  1.  CAD s/p recent NSTEMI with findings of occluded RCA with collaterals. Successful PCI with DES of the RCA. Persistently occluded small PL branch due to distal embolization. No other significant disease. EF 50-55%. On DAPT for one year. Brilinta is too expensive so after the first month will transition to Plavix. Continue beta blocker and high dose statin. Discussed dietary modification  which he is already taking seriously. Encourage weight loss and aerobic activity. Encourage phase 2 cardiac Rehab. Follow up in 2 months.   2. HTN uncontrolled. Continue metoprolol. Will add amlodipine 5 mg daily.   3. Hypercholesterolemia. On high dose statin. Plan repeat fasting lab in 2 months  4. Tobacco use. Now quit   Current medicines are reviewed at length with the patient today.  The patient does not have concerns regarding medicines.  The following changes have been made:  See above  Labs/ tests ordered today include:   Orders Placed This Encounter  Procedures  . Basic metabolic panel  . Lipid panel  . Hepatic function panel  . EKG 12-Lead     Disposition:   FU with APP in 2 months  Signed, Jizel Cheeks [place], MD  09/25/2020 8:39 AM    [facility] [address], [place], [place], [address] Phone [phone], Fax [phone]

## 2020-09-25 ENCOUNTER — Encounter: Payer: Self-pay | Admitting: Cardiology

## 2020-09-25 ENCOUNTER — Other Ambulatory Visit: Payer: Self-pay

## 2020-09-25 ENCOUNTER — Ambulatory Visit (INDEPENDENT_AMBULATORY_CARE_PROVIDER_SITE_OTHER): Payer: Self-pay | Admitting: Cardiology

## 2020-09-25 VITALS — BP 150/90 | HR 61 | Ht 70.0 in | Wt 260.4 lb

## 2020-09-25 DIAGNOSIS — Z720 Tobacco use: Secondary | ICD-10-CM

## 2020-09-25 DIAGNOSIS — I25118 Atherosclerotic heart disease of native coronary artery with other forms of angina pectoris: Secondary | ICD-10-CM

## 2020-09-25 DIAGNOSIS — I10 Essential (primary) hypertension: Secondary | ICD-10-CM

## 2020-09-25 DIAGNOSIS — I214 Non-ST elevation (NSTEMI) myocardial infarction: Secondary | ICD-10-CM

## 2020-09-25 DIAGNOSIS — E7800 Pure hypercholesterolemia, unspecified: Secondary | ICD-10-CM

## 2020-09-25 MED ORDER — AMLODIPINE BESYLATE 5 MG PO TABS: 5.0000 mg | ORAL_TABLET | Freq: Every day | ORAL | 3 refills | Status: DC

## 2020-09-25 MED ORDER — CLOPIDOGREL BISULFATE 75 MG PO TABS: 75.0000 mg | ORAL_TABLET | Freq: Every day | ORAL | 3 refills | Status: DC

## 2020-09-25 NOTE — Patient Instructions (Addendum)
Add amlodipine 5 mg daily  When you finish taking Brilinta the next day take Plavix 4 tablets for total 300 mg then take 75 mg daily  We will follow up in 2 months with lab work

## 2020-10-04 ENCOUNTER — Telehealth: Payer: Self-pay | Admitting: Cardiology

## 2020-10-04 NOTE — Telephone Encounter (Signed)
Have him stop atorvastatin and start rosuvastatin 40 mg daily.  Most likely cause of diarrhea.  Much less incidence with rosuvastatin.  He should avoid using activated charcoal, as it will bind medications and prevent absorption.  Bleeding could be due to strain with constipation since it is bright red.  If pain/bleeding continues should see PCP or GI, but switch statin out first.

## 2020-10-04 NOTE — Telephone Encounter (Signed)
Spoke with pt, he will stop the atorvastatin and see if over the weekend his symptoms improve. He will call first of the week and let us know how things are going. If his symptoms improve we will start the new meds. If symptoms do not improve will get him to his PCP.

## 2020-10-04 NOTE — Telephone Encounter (Signed)
Spoke with pt, he has noticed since discharge lower abdominal pain. He reports it increases when he stands but there is a low pain even with sitting. He also has noticed 3x sone bright red blood on the tissue when he wipes. he has had a hemorrhoid in the past but no problems with it for sometime. He is having loose BM and diarrhea and then also some constipation. He has not had any nausea or vomiting, he is forcing himself to eat because of no appetite but is drinking plenty of fluids. In the past when he has had problems he has taken activated charcoal and that clears it up but he is afraid to do that now with all the medications he is taking. He is switching over to plavix when his current brilinta bottle is empty. Aware will forward to dr [place] and the pharm md to review and advise.

## 2020-10-04 NOTE — Telephone Encounter (Signed)
Pt c/o medication issue:  1. Name of Medication:  Brilinta 90 mg atorvastatin (LIPITOR) 80 MG tablet metoprolol tartrate (LOPRESSOR) 25 MG tablet  2. How are you currently taking this medication (dosage and times per day)?   Brilinta 90 mg - 1 tablet 2x daily Atorvastatin 80 mg - 1 tablet daily Metoprolol 25 mg - 1 tablet 2x daily  3. Are you having a reaction (difficulty breathing--STAT)? No   4. What is your medication issue? Patient states he has been experiencing abdominal pain (when standing), diarrhea, and blood in his stool. He assumes one of these medications is causing these side effects. Please call to discuss.

## 2020-10-07 MED ORDER — ROSUVASTATIN CALCIUM 40 MG PO TABS: 40.0000 mg | ORAL_TABLET | Freq: Every day | ORAL | 3 refills | Status: DC

## 2020-10-07 NOTE — Addendum Note (Signed)
Addended by: Regis Bill B on: 10/07/2020 09:13 AM   Modules accepted: Orders

## 2020-10-07 NOTE — Telephone Encounter (Signed)
Spoke with patient and he will start Crestor as recommended

## 2020-10-07 NOTE — Telephone Encounter (Signed)
° ° °  Pt is calling back, he said as advised he didn't take his Atorvastatin over the weekend and his symptoms cleared up. He wanted to know what he needs to do next

## 2020-11-27 LAB — HEPATIC FUNCTION PANEL
ALT: 24 IU/L (ref 0–44)
AST: 19 IU/L (ref 0–40)
Albumin: 4.5 g/dL (ref 3.8–4.8)
Alkaline Phosphatase: 99 IU/L (ref 44–121)
Bilirubin Total: 0.4 mg/dL (ref 0.0–1.2)
Bilirubin, Direct: 0.14 mg/dL (ref 0.00–0.40)
Total Protein: 7.5 g/dL (ref 6.0–8.5)

## 2020-11-27 LAB — LIPID PANEL
Chol/HDL Ratio: 2.9 ratio (ref 0.0–5.0)
Cholesterol, Total: 92 mg/dL — ABNORMAL LOW (ref 100–199)
HDL: 32 mg/dL — ABNORMAL LOW (ref >39)
LDL Chol Calc (NIH): 37 mg/dL (ref 0–99)
Triglycerides: 128 mg/dL (ref 0–149)
VLDL Cholesterol Cal: 23 mg/dL (ref 5–40)

## 2020-11-27 LAB — BASIC METABOLIC PANEL
BUN/Creatinine Ratio: 13 (ref 10–24)
BUN: 14 mg/dL (ref 8–27)
CO2: 26 mmol/L (ref 20–29)
Calcium: 9.4 mg/dL (ref 8.6–10.2)
Chloride: 100 mmol/L (ref 96–106)
Creatinine, Ser: 1.09 mg/dL (ref 0.76–1.27)
GFR calc Af Amer: 83 mL/min/{1.73_m2} (ref >59)
GFR calc non Af Amer: 72 mL/min/{1.73_m2} (ref >59)
Glucose: 101 mg/dL — ABNORMAL HIGH (ref 65–99)
Potassium: 5.1 mmol/L (ref 3.5–5.2)
Sodium: 139 mmol/L (ref 134–144)

## 2020-12-03 ENCOUNTER — Encounter: Payer: Self-pay | Admitting: Radiology

## 2020-12-03 ENCOUNTER — Encounter: Payer: Self-pay | Admitting: Physician Assistant

## 2020-12-03 ENCOUNTER — Ambulatory Visit (INDEPENDENT_AMBULATORY_CARE_PROVIDER_SITE_OTHER): Payer: Self-pay

## 2020-12-03 ENCOUNTER — Ambulatory Visit (INDEPENDENT_AMBULATORY_CARE_PROVIDER_SITE_OTHER): Payer: Self-pay | Admitting: Physician Assistant

## 2020-12-03 ENCOUNTER — Other Ambulatory Visit: Payer: Self-pay

## 2020-12-03 VITALS — BP 134/64 | HR 66 | Ht 70.5 in | Wt 260.6 lb

## 2020-12-03 DIAGNOSIS — E785 Hyperlipidemia, unspecified: Secondary | ICD-10-CM

## 2020-12-03 DIAGNOSIS — I2510 Atherosclerotic heart disease of native coronary artery without angina pectoris: Secondary | ICD-10-CM

## 2020-12-03 DIAGNOSIS — I493 Ventricular premature depolarization: Secondary | ICD-10-CM

## 2020-12-03 DIAGNOSIS — I10 Essential (primary) hypertension: Secondary | ICD-10-CM

## 2020-12-03 DIAGNOSIS — J449 Chronic obstructive pulmonary disease, unspecified: Secondary | ICD-10-CM

## 2020-12-03 NOTE — Progress Notes (Signed)
Enrolled patient for a 3 day Zio XT monitor to be mailed to patients home  

## 2020-12-03 NOTE — Progress Notes (Signed)
Cardiology Office Note:    Date:  12/05/2020   ID:  Preston Ortiz, DOB 03-Jan-1956, MRN [id number]  PCP:  Patient, No Pcp Per  [facility] Cardiologist:  Peter Swaziland, MD  [facility] Electrophysiologist:  None   Referring MD: No ref. provider found   Chief Complaint  Patient presents with  . Follow-up    Seen for Dr. Swaziland    History of Present Illness:    Preston Ortiz is a 65 y.o. male with a hx of CAD, COPD, hyperlipidemia and hypertension.  He recently presented in November 2021 for chest pain, troponin was greater than 23,000.  EKG shows sinus rhythm with right bundle branch block.  Cardiac catheterization showed 100% proximal and mid RCA occlusion with collaterals, stenosis was treated with drug-eluting stent.  He also had 100% occlusion of small RPL 1 with collaterals, this was managed medically.  Echocardiogram showed EF 50 to 55% with inferobasal hypokinesis, grade 1 DD, no significant valve disease.  He was discharged on aspirin and Brilinta.  He was last seen by Dr. Swaziland on 09/25/2020 at which time he continued to have fleeting chest pain.  Due to the cost of Brilinta, he was switched to Plavix.  Fortunately he has quit smoking since discharge.  Amlodipine was added during the last visit due to elevated blood pressure.  Lipitor was switched to Crestor due to side effect.  Patient presents today for follow-up. He is tolerating the Crestor okay. He denies any further chest pain or shortness of breath. He has no orthopnea or PND. He has trace lower extremity edema which appears to be chronic. On arrival, his heart rate was in the 30s by electronic blood pressure cuff. He mentions even at home he noticed sometimes his heart rate is down to the 30s. We got a EKG that demonstrated sinus rhythm with frequent PACs and PVCs which likely responsible for falsely low heart rate on the blood pressure cuff. I recommended a 3-day Holter monitor to assess for prolonged pauses. If  there is no prolonged pauses, will consider increase beta-blocker. Otherwise he can follow-up in 3 months.   Past Medical History:  Diagnosis Date  . Arthritis    osteoathritis  . Coronary artery disease    NSTEMI November 2021. DES of RCA  . GERD (gastroesophageal reflux disease)    ocassional  . Hyperlipidemia   . Hypertension     Past Surgical History:  Procedure Laterality Date  . ANTERIOR CERVICAL DECOMP/DISCECTOMY FUSION N/A 05/16/2020   Procedure: ANTERIOR CERVICAL DECOMPRESSION/DISCECTOMY FUSION CERVICAL FIVE THROUGH CERVICAL SEVEN;  Surgeon: Venita Lick, MD;  Location: [facility] OR;  Service: Orthopedics;  Laterality: N/A;  3.5 hrs Needs pre op anesthesia consult-doesn't have a PCP  . CORONARY STENT INTERVENTION N/A 09/20/2020   Procedure: CORONARY STENT INTERVENTION;  Surgeon: Swaziland, Peter M, MD;  Location: [facility] INVASIVE CV LAB;  Service: Cardiovascular;  Laterality: N/A;  . HEMATOMA EVACUATION N/A 05/16/2020   Procedure: EVACUATION HEMATOMA;  Surgeon: Venita Lick, MD;  Location: [facility] OR;  Service: Orthopedics;  Laterality: N/A;  . LEFT HEART CATH AND CORONARY ANGIOGRAPHY N/A 09/20/2020   Procedure: LEFT HEART CATH AND CORONARY ANGIOGRAPHY;  Surgeon: Swaziland, Peter M, MD;  Location: [facility] INVASIVE CV LAB;  Service: Cardiovascular;  Laterality: N/A;  . TONSILLECTOMY     chilhood  . TOTAL HIP ARTHROPLASTY Right 11/06/2016   Procedure: RIGHT TOTAL HIP ARTHROPLASTY ANTERIOR APPROACH;  Surgeon: Kathryne Hitch, MD;  Location: [facility] ORS;  Service: Orthopedics;  Laterality: Right;    Current Medications: Current Meds  Medication Sig  . amLODipine (NORVASC) 5 MG tablet Take 1 tablet (5 mg total) by mouth daily.  Marland Kitchen aspirin EC 81 MG EC tablet Take 1 tablet (81 mg total) by mouth daily. Swallow whole.  . clopidogrel (PLAVIX) 75 MG tablet Take 1 tablet (75 mg total) by mouth daily.  . metoprolol tartrate (LOPRESSOR) 25 MG tablet Take 1 tablet (25 mg total) by mouth 2 (two) times daily.   . rosuvastatin (CRESTOR) 40 MG tablet Take 1 tablet (40 mg total) by mouth daily.     Allergies:   Cyclobenzaprine, Gabapentin, Lipitor [atorvastatin], and Penicillins   Social History   Socioeconomic History  . Marital status: Married    Spouse name: Not on file  . Number of children: Not on file  . Years of education: Not on file  . Highest education level: Not on file  Occupational History  . Not on file  Tobacco Use  . Smoking status: Former Smoker    Packs/day: 1.00    Years: 40.00    Pack years: 40.00    Types: Cigarettes    Quit date: 04/2020    Years since quitting: 0.6  . Smokeless tobacco: Never Used  Vaping Use  . Vaping Use: Never used  Substance and Sexual Activity  . Alcohol use: Yes    Comment: half a pint of liquor Q3 days   . Drug use: No  . Sexual activity: Yes  Other Topics Concern  . Not on file  Social History Narrative  . Not on file   Social Determinants of Health   Financial Resource Strain: Not on file  Food Insecurity: Not on file  Transportation Needs: Not on file  Physical Activity: Not on file  Stress: Not on file  Social Connections: Not on file     Family History: The patient's family history includes Cancer in his mother. There is no history of CAD.  ROS:   Please see the history of present illness.     All other systems reviewed and are negative.  EKGs/Labs/Other Studies Reviewed:    The following studies were reviewed today:  Echo 09/20/2020 1. Left ventricular ejection fraction, by estimation, is 50 to 55%. The  left ventricle has low normal function. The left ventricle demonstrates  regional wall motion abnormalities (see scoring diagram/findings for  description). Left ventricular diastolic  parameters are consistent with Grade I diastolic dysfunction (impaired  relaxation). There is moderate hypokinesis of the left ventricular, basal  inferoseptal wall, inferior wall and inferolateral wall.  2. Right  ventricular systolic function is normal. The right ventricular  size is normal.  3. The mitral valve is normal in structure. No evidence of mitral valve  regurgitation. No evidence of mitral stenosis.  4. The aortic valve is normal in structure. Aortic valve regurgitation is  not visualized. No aortic stenosis is present.  5. The inferior vena cava is dilated in size with <50% respiratory  variability, suggesting right atrial pressure of 15 mmHg.    Cath 09/20/2020  Mid LAD to Dist LAD lesion is 30% stenosed.  Prox RCA to Mid RCA lesion is 100% stenosed.  1st RPL lesion is 100% stenosed.  Post intervention, there is a 0% residual stenosis.  A drug-eluting stent was successfully placed using a STENT RESOLUTE ONYX 3.0X26.  The left ventricular systolic function is normal.  LV end diastolic pressure is normal.  The left ventricular ejection fraction is 55-65% by  visual estimate.   1. 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 2. Normal LV function 3. Normal LVEDP 4. Successful PCI of the proximal RCA with DES x1.   Plan: DAPT for one year. Anticipate DC tomorrow.    EKG:  EKG is ordered today.  The ekg ordered today demonstrates sinus rhythm with PACs and PVCs  Recent Labs: 09/20/2020: Magnesium 2.1 09/21/2020: Hemoglobin 12.6; Platelets 260 11/26/2020: ALT 24; BUN 14; Creatinine, Ser 1.09; Potassium 5.1; Sodium 139  Recent Lipid Panel    Component Value Date/Time   CHOL 92 (L) 11/26/2020 0831   TRIG 128 11/26/2020 0831   HDL 32 (L) 11/26/2020 0831   CHOLHDL 2.9 11/26/2020 0831   CHOLHDL 4.5 09/20/2020 0207   VLDL 24 09/20/2020 0207   LDLCALC 37 11/26/2020 0831     Risk Assessment/Calculations:       Physical Exam:    VS:  BP 134/64   Pulse 66   Ht 5' 10.5" (1.791 m)   Wt 260 lb 9.6 oz (118.2 kg)   SpO2 99%   BMI 36.86 kg/m     Wt Readings from Last 3 Encounters:  12/03/20 260 lb 9.6 oz (118.2 kg)  09/25/20 260 lb 6.4  oz (118.1 kg)  09/20/20 254 lb 3.2 oz (115.3 kg)     GEN:  Well nourished, well developed in no acute distress HEENT: Normal NECK: No JVD; No carotid bruits LYMPHATICS: No lymphadenopathy CARDIAC: RRR, no murmurs, rubs, gallops RESPIRATORY:  Clear to auscultation without rales, wheezing or rhonchi  ABDOMEN: Soft, non-tender, non-distended MUSCULOSKELETAL:  No edema; No deformity  SKIN: Warm and dry NEUROLOGIC:  Alert and oriented x 3 PSYCHIATRIC:  Normal affect   ASSESSMENT:    1. Coronary artery disease involving native coronary artery of native heart without angina pectoris   2. PVC (premature ventricular contraction)   3. Chronic obstructive pulmonary disease, unspecified COPD type (HCC)   4. Primary hypertension   5. Hyperlipidemia with target LDL less than 70    PLAN:    In order of problems listed above:  1. CAD: Denies any chest pain.  He has been switched from Brilinta to Plavix due to financial reason.  Continue on Plavix and aspirin.  2. PVCs: He has been noticing that sometimes his heart rate dipped down to the 30s electronic blood pressure cuff.  Heart rate was documented as 30s when he first arrived in our office as well when measured on electronic blood pressure cuff.  EKG however shows frequent PACs and PVCs which likely explain the falsely low heart rate.  He has been asymptomatic without any dizziness, blurred vision or feeling of passing out.  I recommended 3-day heart monitor to assess for PVC burden and make sure there is no significant prolonged pauses.  3. COPD: No acute exacerbation  4. Hypertension: Continue metoprolol  5. Hyperlipidemia: On Crestor 40 mg daily.        Medication Adjustments/Labs and Tests Ordered: Current medicines are reviewed at length with the patient today.  Concerns regarding medicines are outlined above.  Orders Placed This Encounter  Procedures  . LONG TERM MONITOR (3-14 DAYS)  . EKG 12-Lead   No orders of the defined  types were placed in this encounter.   Patient Instructions  Medication Instructions:  Your physician recommends that you continue on your current medications as directed. Please refer to the Current Medication list given to you today.  *If you need a refill on your cardiac  medications before your next appointment, please call your pharmacy*  Lab Work: NONE ordered at this time of appointment   If you have labs (blood work) drawn today and your tests are completely normal, you will receive your results only by: Marland Kitchen MyChart Message (if you have MyChart) OR . A paper copy in the mail If you have any lab test that is abnormal or we need to change your treatment, we will call you to review the results.  Testing/Procedures: Christena Deem- Long Term Monitor Instructions   Your physician has requested you wear your ZIO patch monitor 3 days.   This is a single patch monitor.  [employer] supplies one patch monitor per enrollment.  Additional stickers are not available.   Please do not apply patch if you will be having a Nuclear Stress Test, Echocardiogram, Cardiac CT, MRI, or Chest Xray during the time frame you would be wearing the monitor. The patch cannot be worn during these tests.  You cannot remove and re-apply the ZIO XT patch monitor.   Your ZIO patch monitor will be sent USPS Priority mail from [facility] directly to your home address. The monitor may also be mailed to a PO BOX if home delivery is not available.   It may take 3-5 days to receive your monitor after you have been enrolled.   Once you have received you monitor, please review enclosed instructions.  Your monitor has already been registered assigning a specific monitor serial # to you.   Applying the monitor   Shave hair from upper left chest.   Hold abrader disc by orange tab.  Rub abrader in 40 strokes over left upper chest as indicated in your monitor instructions.   Clean area with 4 enclosed alcohol pads .  Use all  pads to assure are is cleaned thoroughly.  Let dry.   Apply patch as indicated in monitor instructions.  Patch will be place under collarbone on left side of chest with arrow pointing upward.   Rub patch adhesive wings for 2 minutes.Remove white label marked "1".  Remove white label marked "2".  Rub patch adhesive wings for 2 additional minutes.   While looking in a mirror, press and release button in center of patch.  A small green light will flash 3-4 times .  This will be your only indicator the monitor has been turned on.     Do not shower for the first 24 hours.  You may shower after the first 24 hours.   Press button if you feel a symptom. You will hear a small click.  Record Date, Time and Symptom in the Patient Log Book.   When you are ready to remove patch, follow instructions on last 2 pages of Patient Log Book.  Stick patch monitor onto last page of Patient Log Book.   Place Patient Log Book in [place] box.  Use locking tab on box and tape box closed securely.  The Orange and [employer] has [employer] on it.  Please place in mailbox as soon as possible.  Your physician should have your test results approximately 7 days after the monitor has been mailed back to [facility].   Call [facility] Customer Care at [phone] if you have questions regarding your ZIO XT patch monitor.  Call them immediately if you see an orange light blinking on your monitor.   If your monitor falls off in less than 4 days contact our Monitor department at [phone].  If your monitor  becomes loose or falls off after 4 days call [employer] at [phone] for suggestions on securing your monitor.     Follow-Up: At [facility], you and your health needs are our priority.  As part of our continuing mission to provide you with exceptional heart care, we have created designated Provider Care Teams.  These Care Teams include your primary Cardiologist (physician) and Advanced Practice Providers  (APPs -  Physician Assistants and Nurse Practitioners) who all work together to provide you with the care you need, when you need it.  We recommend signing up for the patient portal called "MyChart".  Sign up information is provided on this After Visit Summary.  MyChart is used to connect with patients for Virtual Visits (Telemedicine).  Patients are able to view lab/test results, encounter notes, upcoming appointments, etc.  Non-urgent messages can be sent to your provider as well.   To learn more about what you can do with MyChart, go to [email or web address].    Your next appointment:   3 month(s)  The format for your next appointment:   In Person  Provider:   Peter Swaziland, MD  Other Instructions      Signed, Azalee Course, PA  12/05/2020 10:50 PM    [facility]  [facility]

## 2020-12-03 NOTE — Patient Instructions (Signed)
Medication Instructions:  Your physician recommends that you continue on your current medications as directed. Please refer to the Current Medication list given to you today.  *If you need a refill on your cardiac medications before your next appointment, please call your pharmacy*  Lab Work: NONE ordered at this time of appointment   If you have labs (blood work) drawn today and your tests are completely normal, you will receive your results only by: Marland Kitchen MyChart Message (if you have MyChart) OR . A paper copy in the mail If you have any lab test that is abnormal or we need to change your treatment, we will call you to review the results.  Testing/Procedures: Bryn Gulling- Long Term Monitor Instructions   Your physician has requested you wear your ZIO patch monitor 3 days.   This is a single patch monitor.  Irhythm supplies one patch monitor per enrollment.  Additional stickers are not available.   Please do not apply patch if you will be having a Nuclear Stress Test, Echocardiogram, Cardiac CT, MRI, or Chest Xray during the time frame you would be wearing the monitor. The patch cannot be worn during these tests.  You cannot remove and re-apply the ZIO XT patch monitor.   Your ZIO patch monitor will be sent USPS Priority mail from [facility] directly to your home address. The monitor may also be mailed to a PO BOX if home delivery is not available.   It may take 3-5 days to receive your monitor after you have been enrolled.   Once you have received you monitor, please review enclosed instructions.  Your monitor has already been registered assigning a specific monitor serial # to you.   Applying the monitor   Shave hair from upper left chest.   Hold abrader disc by orange tab.  Rub abrader in 40 strokes over left upper chest as indicated in your monitor instructions.   Clean area with 4 enclosed alcohol pads .  Use all pads to assure are is cleaned thoroughly.  Let dry.   Apply  patch as indicated in monitor instructions.  Patch will be place under collarbone on left side of chest with arrow pointing upward.   Rub patch adhesive wings for 2 minutes.Remove white label marked "1".  Remove white label marked "2".  Rub patch adhesive wings for 2 additional minutes.   While looking in a mirror, press and release button in center of patch.  A small green light will flash 3-4 times .  This will be your only indicator the monitor has been turned on.     Do not shower for the first 24 hours.  You may shower after the first 24 hours.   Press button if you feel a symptom. You will hear a small click.  Record Date, Time and Symptom in the Patient Log Book.   When you are ready to remove patch, follow instructions on last 2 pages of Patient Log Book.  Stick patch monitor onto last page of Patient Log Book.   Place Patient Log Book in [place] box.  Use locking tab on box and tape box closed securely.  The Orange and [employer] has [employer] on it.  Please place in mailbox as soon as possible.  Your physician should have your test results approximately 7 days after the monitor has been mailed back to [facility].   Call [place] at [phone] if you have questions regarding your ZIO XT patch monitor.  Call them immediately if you see an orange light blinking on your monitor.   If your monitor falls off in less than 4 days contact our Monitor department at [phone].  If your monitor becomes loose or falls off after 4 days call [employer] at [phone] for suggestions on securing your monitor.     Follow-Up: At [facility], you and your health needs are our priority.  As part of our continuing mission to provide you with exceptional heart care, we have created designated Provider Care Teams.  These Care Teams include your primary Cardiologist (physician) and Advanced Practice Providers (APPs -  Physician Assistants and Nurse Practitioners) who all  work together to provide you with the care you need, when you need it.  We recommend signing up for the patient portal called "MyChart".  Sign up information is provided on this After Visit Summary.  MyChart is used to connect with patients for Virtual Visits (Telemedicine).  Patients are able to view lab/test results, encounter notes, upcoming appointments, etc.  Non-urgent messages can be sent to your provider as well.   To learn more about what you can do with MyChart, go to [email or web address].    Your next appointment:   3 month(s)  The format for your next appointment:   In Person  Provider:   Peter Swaziland, MD  Other Instructions

## 2020-12-05 ENCOUNTER — Encounter: Payer: Self-pay | Admitting: Physician Assistant

## 2020-12-25 ENCOUNTER — Telehealth: Payer: Self-pay | Admitting: *Deleted

## 2020-12-25 DIAGNOSIS — I493 Ventricular premature depolarization: Secondary | ICD-10-CM

## 2020-12-25 NOTE — Progress Notes (Signed)
I have called and discussed with the patient regarding the finding. I spoke with Dr. Swaziland, we recommended EP referral. Patient lives in [place] and preferred to see EP service there. Referral made

## 2020-12-25 NOTE — Telephone Encounter (Signed)
Discussed with patient regarding monitor result, he really prefers to see EP in [place], I think Dr. Ladona Ridgel goes to our [place] office, will make arrangement

## 2020-12-25 NOTE — Telephone Encounter (Signed)
Referral send to electrophysiology as per Azalee Course, PA-C

## 2021-01-27 ENCOUNTER — Other Ambulatory Visit: Payer: Self-pay | Admitting: Cardiology

## 2021-02-11 ENCOUNTER — Encounter: Payer: Self-pay | Admitting: Internal Medicine

## 2021-02-11 ENCOUNTER — Other Ambulatory Visit: Payer: Self-pay

## 2021-02-11 ENCOUNTER — Ambulatory Visit (INDEPENDENT_AMBULATORY_CARE_PROVIDER_SITE_OTHER): Payer: Self-pay | Admitting: Internal Medicine

## 2021-02-11 VITALS — BP 132/80 | HR 33 | Ht 70.0 in | Wt 260.4 lb

## 2021-02-11 DIAGNOSIS — I493 Ventricular premature depolarization: Secondary | ICD-10-CM

## 2021-02-11 DIAGNOSIS — I25118 Atherosclerotic heart disease of native coronary artery with other forms of angina pectoris: Secondary | ICD-10-CM

## 2021-02-11 MED ORDER — MEXILETINE HCL 200 MG PO CAPS: 200.0000 mg | ORAL_CAPSULE | Freq: Two times a day (BID) | ORAL | 3 refills | Status: DC

## 2021-02-11 NOTE — Patient Instructions (Signed)
Medication Instructions:  START Mexiletine 300 mg twice a day   AVOID caffeine, and alcohol   *If you need a refill on your cardiac medications before your next appointment, please call your pharmacy*   Lab Work: None today If you have labs (blood work) drawn today and your tests are completely normal, you will receive your results only by: Marland Kitchen MyChart Message (if you have MyChart) OR . A paper copy in the mail If you have any lab test that is abnormal or we need to change your treatment, we will call you to review the results.   Testing/Procedures: None today   Follow-Up: At [facility], you and your health needs are our priority.  As part of our continuing mission to provide you with exceptional heart care, we have created designated Provider Care Teams.  These Care Teams include your primary Cardiologist (physician) and Advanced Practice Providers (APPs -  Physician Assistants and Nurse Practitioners) who all work together to provide you with the care you need, when you need it.  We recommend signing up for the patient portal called "MyChart".  Sign up information is provided on this After Visit Summary.  MyChart is used to connect with patients for Virtual Visits (Telemedicine).  Patients are able to view lab/test results, encounter notes, upcoming appointments, etc.  Non-urgent messages can be sent to your provider as well.   To learn more about what you can do with MyChart, go to [email or web address].    Your next appointment:   6 week(s)  The format for your next appointment:   In Person  Provider:   Lewayne Bunting, MD   Other Instructions Nurse appointment in 2 weeks for EKG

## 2021-02-11 NOTE — Progress Notes (Signed)
HPI REED DADY is a 65 y.o. male with a hx of CAD, COPD, hyperlipidemia and hypertension.  He recently presented in November 2021 for chest pain, troponin was greater than 23,000.  EKG shows sinus rhythm with right bundle branch block.  Cardiac catheterization showed 100% proximal and mid RCA occlusion with collaterals, stenosis was treated with drug-eluting stent.  He also had 100% occlusion of small RPL 1 with collaterals, this was managed medically.  Echocardiogram showed EF 50 to 55% j. He has developed palpitations and a slow heart beat and his ECG and cardiac monitor demonstrate frequent PVC's. He has not had syncope.  Allergies  Allergen Reactions  . Cyclobenzaprine     Blurred vision   . Gabapentin     Bloody stools   . Lipitor [Atorvastatin]     GI issues (diarrhea)   . Penicillins     childhood allergy  Has patient had a PCN reaction causing immediate rash, facial/tongue/throat swelling, SOB or lightheadedness with hypotension: Unknown Has patient had a PCN reaction causing severe rash involving mucus membranes or skin necrosis: Unknown Has patient had a PCN reaction that required hospitalization Unknown Has patient had a PCN reaction occurring within the last 10 years: No If all of the above answers are "NO", then may proceed with Cephalosporin use.      Current Outpatient Medications  Medication Sig Dispense Refill  . amLODipine (NORVASC) 5 MG tablet Take 1 tablet (5 mg total) by mouth daily. 90 tablet 3  . aspirin EC 81 MG EC tablet Take 1 tablet (81 mg total) by mouth daily. Swallow whole. 30 tablet 11  . clopidogrel (PLAVIX) 75 MG tablet Take 1 tablet (75 mg total) by mouth daily. 90 tablet 3  . metoprolol tartrate (LOPRESSOR) 25 MG tablet Take 1 tablet (25 mg total) by mouth 2 (two) times daily. 180 tablet 3  . rosuvastatin (CRESTOR) 40 MG tablet Take 1 tablet by mouth once daily 90 tablet 0  . traZODone (DESYREL) 50 MG tablet TAKE 1/2 (ONE-HALF) TABLET  BY MOUTH ONCE DAILY AS NEEDED     No current facility-administered medications for this visit.     Past Medical History:  Diagnosis Date  . Arthritis    osteoathritis  . Coronary artery disease    NSTEMI November 2021. DES of RCA  . GERD (gastroesophageal reflux disease)    ocassional  . Hyperlipidemia   . Hypertension     ROS:   All systems reviewed and negative except as noted in the HPI.   Past Surgical History:  Procedure Laterality Date  . ANTERIOR CERVICAL DECOMP/DISCECTOMY FUSION N/A 05/16/2020   Procedure: ANTERIOR CERVICAL DECOMPRESSION/DISCECTOMY FUSION CERVICAL FIVE THROUGH CERVICAL SEVEN;  Surgeon: Venita Lick, MD;  Location: [facility] OR;  Service: Orthopedics;  Laterality: N/A;  3.5 hrs Needs pre op anesthesia consult-doesn't have a PCP  . CORONARY STENT INTERVENTION N/A 09/20/2020   Procedure: CORONARY STENT INTERVENTION;  Surgeon: Swaziland, Peter M, MD;  Location: [facility] INVASIVE CV LAB;  Service: Cardiovascular;  Laterality: N/A;  . HEMATOMA EVACUATION N/A 05/16/2020   Procedure: EVACUATION HEMATOMA;  Surgeon: Venita Lick, MD;  Location: [facility] OR;  Service: Orthopedics;  Laterality: N/A;  . LEFT HEART CATH AND CORONARY ANGIOGRAPHY N/A 09/20/2020   Procedure: LEFT HEART CATH AND CORONARY ANGIOGRAPHY;  Surgeon: Swaziland, Peter M, MD;  Location: [facility] INVASIVE CV LAB;  Service: Cardiovascular;  Laterality: N/A;  . TONSILLECTOMY     chilhood  . TOTAL HIP ARTHROPLASTY Right  11/06/2016   Procedure: RIGHT TOTAL HIP ARTHROPLASTY ANTERIOR APPROACH;  Surgeon: Kathryne Hitch, MD;  Location: [facility] ORS;  Service: Orthopedics;  Laterality: Right;     Family History  Problem Relation Age of Onset  . Cancer Mother   . CAD Neg Hx      Social History   Socioeconomic History  . Marital status: Married    Spouse name: Not on file  . Number of children: Not on file  . Years of education: Not on file  . Highest education level: Not on file  Occupational History  . Not on file   Tobacco Use  . Smoking status: Former Smoker    Packs/day: 1.00    Years: 40.00    Pack years: 40.00    Types: Cigarettes    Quit date: 04/2020    Years since quitting: 0.8  . Smokeless tobacco: Never Used  Vaping Use  . Vaping Use: Never used  Substance and Sexual Activity  . Alcohol use: Yes    Comment: half a pint of liquor Q3 days   . Drug use: No  . Sexual activity: Yes  Other Topics Concern  . Not on file  Social History Narrative  . Not on file   Social Determinants of Health   Financial Resource Strain: Not on file  Food Insecurity: Not on file  Transportation Needs: Not on file  Physical Activity: Not on file  Stress: Not on file  Social Connections: Not on file  Intimate Partner Violence: Not on file     BP 132/80   Pulse (!) 33   Ht 5\' 10"  (1.778 m)   Wt 260 lb 6.4 oz (118.1 kg)   SpO2 100%   BMI 37.36 kg/m   Physical Exam:  Well appearing NAD HEENT: Unremarkable Neck:  No JVD, no thyromegally Lymphatics:  No adenopathy Back:  No CVA tenderness Lungs:  Clear with no wheezes HEART:  IRegular rate rhythm, no murmurs, no rubs, no clicks Abd:  soft, positive bowel sounds, no organomegally, no rebound, no guarding Ext:  2 plus pulses, no edema, no cyanosis, no clubbing Skin:  No rashes no nodules Neuro:  CN II through XII intact, motor grossly intact   Assess/Plan: 1. PVC's - he has some symptoms but over a third of his beats are PVC's. Unfortunately there are multiple morphologies. As such I have recommended medical therapy to suppress the PVC's. He will try mexitil 300 mg twice daily. He will return for a 12 lead ECG. I will see him back in 6 weeks. I discussed sotalol and amiodarone as well. He is encouraged to avoid caffeine. 2. CAD - he is s/p stenting and has had no chest pain.  3. Dyslipidemia - continue statin therapy.  Adamary Savary,MD

## 2021-02-25 ENCOUNTER — Other Ambulatory Visit (INDEPENDENT_AMBULATORY_CARE_PROVIDER_SITE_OTHER): Payer: Self-pay

## 2021-02-25 ENCOUNTER — Other Ambulatory Visit: Payer: Self-pay

## 2021-02-25 VITALS — BP 146/76 | HR 70

## 2021-02-25 DIAGNOSIS — I493 Ventricular premature depolarization: Secondary | ICD-10-CM

## 2021-02-25 MED ORDER — MEXILETINE HCL 150 MG PO CAPS: 300.0000 mg | ORAL_CAPSULE | Freq: Two times a day (BID) | ORAL | 6 refills | Status: DC

## 2021-02-25 NOTE — Patient Instructions (Signed)
Medication Instructions:  INCREASE Mexitil to 300 mg twice a day   *If you need a refill on your cardiac medications before your next appointment, please call your pharmacy*   Lab Work: None today If you have labs (blood work) drawn today and your tests are completely normal, you will receive your results only by: Marland Kitchen MyChart Message (if you have MyChart) OR . A paper copy in the mail If you have any lab test that is abnormal or we need to change your treatment, we will call you to review the results.   Testing/Procedures: None today   Follow-Up: At [facility], you and your health needs are our priority.  As part of our continuing mission to provide you with exceptional heart care, we have created designated Provider Care Teams.  These Care Teams include your primary Cardiologist (physician) and Advanced Practice Providers (APPs -  Physician Assistants and Nurse Practitioners) who all work together to provide you with the care you need, when you need it.  We recommend signing up for the patient portal called "MyChart".  Sign up information is provided on this After Visit Summary.  MyChart is used to connect with patients for Virtual Visits (Telemedicine).  Patients are able to view lab/test results, encounter notes, upcoming appointments, etc.  Non-urgent messages can be sent to your provider as well.   To learn more about what you can do with MyChart, go to [email or web address].    Your next appointment:   2 week(s)  The format for your next appointment:   In Person  Provider:   Nurse appointment   Other Instructions None

## 2021-02-25 NOTE — Progress Notes (Unsigned)
Patient started on Mexitil 200 mg BID 2 weeks ago. He reports instant relief one day after starting drug. EKG reviewed by Dr.Taylor.SR with PVC's.  Mexitil increased to 300 mg twice a day. Per Dr.Taylor, return in 2weeks for repeat EKG

## 2021-02-27 NOTE — Progress Notes (Signed)
Cardiology Office Note:    Date:  03/03/2021   ID:  Preston Ortiz, DOB 03-26-1956, MRN [id number]  PCP:  Patient, No Pcp Per (Inactive)  [facility] Cardiologist:  Preston Bunting, MD  [facility] Electrophysiologist:  None   Referring MD: No ref. provider found   Chief Complaint  Patient presents with  . Coronary Artery Disease    History of Present Illness:    Preston Ortiz is a 65 y.o. male with a hx of CAD, COPD, hyperlipidemia and hypertension.  He recently presented in November 2021 for chest pain, troponin was greater than 23,000.  EKG shows sinus rhythm with right bundle branch block.  Cardiac catheterization showed 100% proximal and mid RCA occlusion with collaterals, stenosis was treated with drug-eluting stent.  He also had 100% occlusion of small RPL 1 with collaterals, this was managed medically.  Echocardiogram showed EF 50 to 55% with inferobasal hypokinesis, grade 1 DD, no significant valve disease.  He was discharged on aspirin and Brilinta.  He was last seen by Dr. Swaziland on 09/25/2020 at which time he continued to have fleeting chest pain.  Due to the cost of Brilinta, he was switched to Plavix.  Fortunately he has quit smoking since discharge.  Amlodipine was added during the last visit due to elevated blood pressure.  Lipitor was switched to Crestor due to side effect.  When last seen by Azalee Course PA-C he was noted to have frequent PVCs with a artificially low pulse count. Event monitor was done showing a high PVC burden 39% with multiple morphologies. He was seen by Dr Ladona Ridgel in April who recommended a trial of Mexitil. Dose has increased to 300 mg bid. He notes that since then his palpitations went away. No chest pain or dyspnea. Minor bright blood per rectum for last 2 days with straining. He does smoke occasionally.     Past Medical History:  Diagnosis Date  . Arthritis    osteoathritis  . Coronary artery disease    NSTEMI November 2021. DES of RCA   . GERD (gastroesophageal reflux disease)    ocassional  . Hyperlipidemia   . Hypertension     Past Surgical History:  Procedure Laterality Date  . ANTERIOR CERVICAL DECOMP/DISCECTOMY FUSION N/A 05/16/2020   Procedure: ANTERIOR CERVICAL DECOMPRESSION/DISCECTOMY FUSION CERVICAL FIVE THROUGH CERVICAL SEVEN;  Surgeon: Preston Lick, MD;  Location: [facility] OR;  Service: Orthopedics;  Laterality: N/A;  3.5 hrs Needs pre op anesthesia consult-doesn't have a PCP  . CORONARY STENT INTERVENTION N/A 09/20/2020   Procedure: CORONARY STENT INTERVENTION;  Surgeon: Swaziland, Mckenzie Toruno M, MD;  Location: [facility] INVASIVE CV LAB;  Service: Cardiovascular;  Laterality: N/A;  . HEMATOMA EVACUATION N/A 05/16/2020   Procedure: EVACUATION HEMATOMA;  Surgeon: Preston Lick, MD;  Location: [facility] OR;  Service: Orthopedics;  Laterality: N/A;  . LEFT HEART CATH AND CORONARY ANGIOGRAPHY N/A 09/20/2020   Procedure: LEFT HEART CATH AND CORONARY ANGIOGRAPHY;  Surgeon: Swaziland, Preston Faughn M, MD;  Location: [facility] INVASIVE CV LAB;  Service: Cardiovascular;  Laterality: N/A;  . TONSILLECTOMY     chilhood  . TOTAL HIP ARTHROPLASTY Right 11/06/2016   Procedure: RIGHT TOTAL HIP ARTHROPLASTY ANTERIOR APPROACH;  Surgeon: Preston Hitch, MD;  Location: [facility] ORS;  Service: Orthopedics;  Laterality: Right;    Current Medications: Current Meds  Medication Sig  . amLODipine (NORVASC) 5 MG tablet Take 1 tablet (5 mg total) by mouth daily.  Marland Kitchen aspirin EC 81 MG EC tablet Take 1 tablet (81  mg total) by mouth daily. Swallow whole.  . metoprolol tartrate (LOPRESSOR) 25 MG tablet Take 1 tablet (25 mg total) by mouth 2 (two) times daily.  Marland Kitchen mexiletine (MEXITIL) 150 MG capsule Take 2 capsules (300 mg total) by mouth 2 (two) times daily.  . rosuvastatin (CRESTOR) 40 MG tablet Take 1 tablet by mouth once daily  . traZODone (DESYREL) 50 MG tablet TAKE 1/2 (ONE-HALF) TABLET BY MOUTH ONCE DAILY AS NEEDED  . [DISCONTINUED] clopidogrel (PLAVIX) 75 MG tablet Take 1  tablet (75 mg total) by mouth daily.     Allergies:   Cyclobenzaprine, Gabapentin, Lipitor [atorvastatin], and Penicillins   Social History   Socioeconomic History  . Marital status: Married    Spouse name: Not on file  . Number of children: Not on file  . Years of education: Not on file  . Highest education level: Not on file  Occupational History  . Not on file  Tobacco Use  . Smoking status: Former Smoker    Packs/day: 1.00    Years: 40.00    Pack years: 40.00    Types: Cigarettes    Quit date: 04/2020    Years since quitting: 0.8  . Smokeless tobacco: Never Used  Vaping Use  . Vaping Use: Never used  Substance and Sexual Activity  . Alcohol use: Yes    Comment: half a pint of liquor Q3 days   . Drug use: No  . Sexual activity: Yes  Other Topics Concern  . Not on file  Social History Narrative  . Not on file   Social Determinants of Health   Financial Resource Strain: Not on file  Food Insecurity: Not on file  Transportation Needs: Not on file  Physical Activity: Not on file  Stress: Not on file  Social Connections: Not on file     Family History: The patient's family history includes Cancer in his mother. There is no history of CAD.  ROS:   Please see the history of present illness.     All other systems reviewed and are negative.  EKGs/Labs/Other Studies Reviewed:    The following studies were reviewed today:  Echo 09/20/2020 1. Left ventricular ejection fraction, by estimation, is 50 to 55%. The  left ventricle has low normal function. The left ventricle demonstrates  regional wall motion abnormalities (see scoring diagram/findings for  description). Left ventricular diastolic  parameters are consistent with Grade I diastolic dysfunction (impaired  relaxation). There is moderate hypokinesis of the left ventricular, basal  inferoseptal wall, inferior wall and inferolateral wall.  2. Right ventricular systolic function is normal. The right  ventricular  size is normal.  3. The mitral valve is normal in structure. No evidence of mitral valve  regurgitation. No evidence of mitral stenosis.  4. The aortic valve is normal in structure. Aortic valve regurgitation is  not visualized. No aortic stenosis is present.  5. The inferior vena cava is dilated in size with <50% respiratory  variability, suggesting right atrial pressure of 15 mmHg.    Cath 09/20/2020  Mid LAD to Dist LAD lesion is 30% stenosed.  Prox RCA to Mid RCA lesion is 100% stenosed.  1st RPL lesion is 100% stenosed.  Post intervention, there is a 0% residual stenosis.  A drug-eluting stent was successfully placed using a STENT RESOLUTE ONYX 3.0X26.  The left ventricular systolic function is normal.  LV end diastolic pressure is normal.  The left ventricular ejection fraction is 55-65% by visual estimate.   1.  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 2. Normal LV function 3. Normal LVEDP 4. Successful PCI of the proximal RCA with DES x1.   Plan: DAPT for one year. Anticipate DC tomorrow.   Event monitor 12/25/20: Study Highlights    NSR wth frequent PVCs, occasional PVC coulets and triplets.  High PVC burden 39%.    EKG:  EKG is not  ordered today.   Recent Labs: 09/20/2020: Magnesium 2.1 09/21/2020: Hemoglobin 12.6; Platelets 260 11/26/2020: ALT 24; BUN 14; Creatinine, Ser 1.09; Potassium 5.1; Sodium 139  Recent Lipid Panel    Component Value Date/Time   CHOL 92 (L) 11/26/2020 0831   TRIG 128 11/26/2020 0831   HDL 32 (L) 11/26/2020 0831   CHOLHDL 2.9 11/26/2020 0831   CHOLHDL 4.5 09/20/2020 0207   VLDL 24 09/20/2020 0207   LDLCALC 37 11/26/2020 0831     Risk Assessment/Calculations:       Physical Exam:    VS:  BP 118/68   Pulse 60   Ht 5\' 10"  (1.778 Ortiz)   Wt 250 lb (113.4 kg)   SpO2 99%   BMI 35.87 kg/Ortiz     Wt Readings from Last 3 Encounters:  03/03/21 250 lb (113.4 kg)  02/11/21 260  lb 6.4 oz (118.1 kg)  12/03/20 260 lb 9.6 oz (118.2 kg)     GEN:  Well nourished, well developed in no acute distress HEENT: Normal NECK: No JVD; No carotid bruits LYMPHATICS: No lymphadenopathy CARDIAC: RRR, no murmurs, rubs, gallops RESPIRATORY:  Clear to auscultation without rales, wheezing or rhonchi  ABDOMEN: Soft, non-tender, non-distended MUSCULOSKELETAL:  No edema; No deformity  SKIN: Warm and dry NEUROLOGIC:  Alert and oriented x 3 PSYCHIATRIC:  Normal affect   ASSESSMENT:    1. Coronary artery disease involving native coronary artery of native heart with other form of angina pectoris (HCC)   2. PVC (premature ventricular contraction)   3. Primary hypertension   4. Hyperlipidemia with target LDL less than 70   5. Tobacco use    PLAN:    In order of problems listed above:  1. CAD: Denies any chest pain.   Continue on Plavix and aspirin for one year. Will be able to stop Plavix in November.  2. PVCs: High burden with multiple morphologies. Seen by Dr December. Now on Mexitil. Tolerating well.   3. COPD: No acute exacerbation  4. Hypertension: well controlled. Continue metoprolol  5. Hyperlipidemia: On Crestor 40 mg daily. Excellent control  6.   Tobacco use recommend complete cessation.         Medication Adjustments/Labs and Tests Ordered: Current medicines are reviewed at length with the patient today.  Concerns regarding medicines are outlined above.  No orders of the defined types were placed in this encounter.  Meds ordered this encounter  Medications  . clopidogrel (PLAVIX) 75 MG tablet    Sig: Take 1 tablet (75 mg total) by mouth daily.    Dispense:  90 tablet    Refill:  3    There are no Patient Instructions on file for this visit.   Signed, Ruchel Brandenburger Ladona Ridgel, MD  03/03/2021 2:56 PM    [facility]

## 2021-02-28 ENCOUNTER — Telehealth: Payer: Self-pay | Admitting: Internal Medicine

## 2021-02-28 NOTE — Telephone Encounter (Signed)
Please give pt a call concerning how much of the mexiletine (MEXITIL) 150 MG capsule [[id number]]  He's supposed to be taking   [phone]

## 2021-02-28 NOTE — Telephone Encounter (Signed)
Left a message to return call to the office.  Per Dr. Lubertha Basque last office note on the pt, pt is supposed to be taking 300 mg of Mexiletine twice daily. (2 capsules, 2x daily).

## 2021-02-28 NOTE — Telephone Encounter (Signed)
Spoke with pt and instructed to take Mexitil 300 mg two times daily. Pt voiced understanding.

## 2021-03-03 ENCOUNTER — Other Ambulatory Visit: Payer: Self-pay

## 2021-03-03 ENCOUNTER — Encounter: Payer: Self-pay | Admitting: Cardiology

## 2021-03-03 ENCOUNTER — Ambulatory Visit (INDEPENDENT_AMBULATORY_CARE_PROVIDER_SITE_OTHER): Payer: Self-pay | Admitting: Cardiology

## 2021-03-03 VITALS — BP 118/68 | HR 60 | Ht 70.0 in | Wt 250.0 lb

## 2021-03-03 DIAGNOSIS — Z720 Tobacco use: Secondary | ICD-10-CM

## 2021-03-03 DIAGNOSIS — I25118 Atherosclerotic heart disease of native coronary artery with other forms of angina pectoris: Secondary | ICD-10-CM

## 2021-03-03 DIAGNOSIS — E785 Hyperlipidemia, unspecified: Secondary | ICD-10-CM

## 2021-03-03 DIAGNOSIS — I493 Ventricular premature depolarization: Secondary | ICD-10-CM

## 2021-03-03 DIAGNOSIS — I10 Essential (primary) hypertension: Secondary | ICD-10-CM

## 2021-03-03 MED ORDER — CLOPIDOGREL BISULFATE 75 MG PO TABS: 75.0000 mg | ORAL_TABLET | Freq: Every day | ORAL | 3 refills | Status: DC

## 2021-03-10 ENCOUNTER — Ambulatory Visit: Payer: Self-pay

## 2021-03-11 ENCOUNTER — Other Ambulatory Visit: Payer: Self-pay

## 2021-03-11 ENCOUNTER — Ambulatory Visit (INDEPENDENT_AMBULATORY_CARE_PROVIDER_SITE_OTHER): Payer: Self-pay | Admitting: *Deleted

## 2021-03-11 DIAGNOSIS — I493 Ventricular premature depolarization: Secondary | ICD-10-CM

## 2021-03-11 NOTE — Progress Notes (Signed)
Patient in [place] office for EKG - sent to Dr. Ladona Ridgel for his review.

## 2021-03-26 ENCOUNTER — Ambulatory Visit: Payer: Self-pay | Admitting: Internal Medicine

## 2021-04-29 ENCOUNTER — Other Ambulatory Visit: Payer: Self-pay | Admitting: Internal Medicine

## 2021-04-29 ENCOUNTER — Encounter: Payer: Self-pay | Admitting: Internal Medicine

## 2021-04-29 ENCOUNTER — Ambulatory Visit (INDEPENDENT_AMBULATORY_CARE_PROVIDER_SITE_OTHER): Payer: Self-pay

## 2021-04-29 ENCOUNTER — Ambulatory Visit (INDEPENDENT_AMBULATORY_CARE_PROVIDER_SITE_OTHER): Payer: Self-pay | Admitting: Internal Medicine

## 2021-04-29 ENCOUNTER — Other Ambulatory Visit: Payer: Self-pay

## 2021-04-29 VITALS — BP 132/78 | HR 36 | Ht 70.5 in | Wt 253.6 lb

## 2021-04-29 DIAGNOSIS — E785 Hyperlipidemia, unspecified: Secondary | ICD-10-CM

## 2021-04-29 DIAGNOSIS — I493 Ventricular premature depolarization: Secondary | ICD-10-CM

## 2021-04-29 DIAGNOSIS — I25118 Atherosclerotic heart disease of native coronary artery with other forms of angina pectoris: Secondary | ICD-10-CM

## 2021-04-29 DIAGNOSIS — Z720 Tobacco use: Secondary | ICD-10-CM

## 2021-04-29 NOTE — Patient Instructions (Signed)
Medication Instructions:  Your physician recommends that you continue on your current medications as directed. Please refer to the Current Medication list given to you today.  *If you need a refill on your cardiac medications before your next appointment, please call your pharmacy*   Lab Work: NONE   If you have labs (blood work) drawn today and your tests are completely normal, you will receive your results only by: [place] (if you have MyChart) OR A paper copy in the mail If you have any lab test that is abnormal or we need to change your treatment, we will call you to review the results.   Testing/Procedures: [place] [place]- Long Term Monitor Instructions   Your physician has requested you wear your ZIO patch monitor___3___days.   This is a single patch monitor.  [employer] supplies one patch monitor per enrollment.  Additional stickers are not available.   Please do not apply patch if you will be having a Nuclear Stress Test, Echocardiogram, Cardiac CT, MRI, or Chest Xray during the time frame you would be wearing the monitor. The patch cannot be worn during these tests.  You cannot remove and re-apply the ZIO XT patch monitor.   Your ZIO patch monitor will be sent USPS Priority mail from [facility] directly to your home address. The monitor may also be mailed to a PO BOX if home delivery is not available.   It may take 3-5 days to receive your monitor after you have been enrolled.   Once you have received you monitor, please review enclosed instructions.  Your monitor has already been registered assigning a specific monitor serial # to you.   Applying the monitor   Shave hair from upper left chest.   Hold abrader disc by orange tab.  Rub abrader in 40 strokes over left upper chest as indicated in your monitor instructions.   Clean area with 4 enclosed alcohol pads .  Use all pads to assure are is cleaned thoroughly.  Let dry.   Apply patch as indicated in monitor  instructions.  Patch will be place under collarbone on left side of chest with arrow pointing upward.   Rub patch adhesive wings for 2 minutes.Remove white label marked "1".  Remove white label marked "2".  Rub patch adhesive wings for 2 additional minutes.   While looking in a mirror, press and release button in center of patch.  A small green light will flash 3-4 times .  This will be your only indicator the monitor has been turned on.     Do not shower for the first 24 hours.  You may shower after the first 24 hours.   Press button if you feel a symptom. You will hear a small click.  Record Date, Time and Symptom in the Patient Log Book.   When you are ready to remove patch, follow instructions on last 2 pages of Patient Log Book.  Stick patch monitor onto last page of Patient Log Book.   Place Patient Log Book in [place] box.  Use locking tab on box and tape box closed securely.  The [employer] and [employer] has [employer] on it.  Please place in mailbox as soon as possible.  Your physician should have your test results approximately 7 days after the monitor has been mailed back to [facility].   Call [place] at [phone] if you have questions regarding your ZIO XT patch monitor.  Call them immediately if you see an orange  light blinking on your monitor.   If your monitor falls off in less than 4 days contact our Monitor department at [phone].  If your monitor becomes loose or falls off after 4 days call [employer] at [phone] for suggestions on securing your monitor.     Follow-Up: At [facility], you and your health needs are our priority.  As part of our continuing mission to provide you with exceptional heart care, we have created designated Provider Care Teams.  These Care Teams include your primary Cardiologist (physician) and Advanced Practice Providers (APPs -  Physician Assistants and Nurse Practitioners) who all work together to provide you with  the care you need, when you need it.  We recommend signing up for the patient portal called "MyChart".  Sign up information is provided on this After Visit Summary.  MyChart is used to connect with patients for Virtual Visits (Telemedicine).  Patients are able to view lab/test results, encounter notes, upcoming appointments, etc.  Non-urgent messages can be sent to your provider as well.   To learn more about what you can do with MyChart, go to [email or web address].    Your next appointment:   1 year(s)  The format for your next appointment:   In Person  Provider:   Lewayne Bunting, MD   Other Instructions Thank you for choosing [facility]!

## 2021-04-29 NOTE — Progress Notes (Signed)
HPI Preston Ortiz returns today for followup of his PVC's. He is a pleasant 65 yo with ongoing tobacco abuse despite AMI back in November where her presented late. He was found to have dense PVC's (36K in 24 hours) and I treated him with mexitil. He thinks that his palpitations have improved. He denies chest pain or sob. Despite his late presentation his EF is only mildly decreased. He thinks that his palpitations have improved since starting the mexitil. He has developed some cervical spine problems and has been sedentary. He had stopped smoking but now admits to smoking a half pack a day.  Allergies  Allergen Reactions   Cyclobenzaprine     Blurred vision    Gabapentin     Bloody stools    Lipitor [Atorvastatin]     GI issues (diarrhea)    Penicillins     childhood allergy  Has patient had a PCN reaction causing immediate rash, facial/tongue/throat swelling, SOB or lightheadedness with hypotension: Unknown Has patient had a PCN reaction causing severe rash involving mucus membranes or skin necrosis: Unknown Has patient had a PCN reaction that required hospitalization Unknown Has patient had a PCN reaction occurring within the last 10 years: No If all of the above answers are "NO", then may proceed with Cephalosporin use.      Current Outpatient Medications  Medication Sig Dispense Refill   amLODipine (NORVASC) 5 MG tablet Take 1 tablet (5 mg total) by mouth daily. 90 tablet 3   aspirin EC 81 MG EC tablet Take 1 tablet (81 mg total) by mouth daily. Swallow whole. 30 tablet 11   clopidogrel (PLAVIX) 75 MG tablet Take 1 tablet (75 mg total) by mouth daily. 90 tablet 3   metoprolol tartrate (LOPRESSOR) 25 MG tablet Take 1 tablet (25 mg total) by mouth 2 (two) times daily. 180 tablet 3   mexiletine (MEXITIL) 150 MG capsule Take 2 capsules (300 mg total) by mouth 2 (two) times daily. 120 capsule 6   rosuvastatin (CRESTOR) 40 MG tablet Take 1 tablet by mouth once daily 90 tablet 0    traZODone (DESYREL) 50 MG tablet TAKE 1/2 (ONE-HALF) TABLET BY MOUTH ONCE DAILY AS NEEDED     No current facility-administered medications for this visit.     Past Medical History:  Diagnosis Date   Arthritis    osteoathritis   Coronary artery disease    NSTEMI November 2021. DES of RCA   GERD (gastroesophageal reflux disease)    ocassional   Hyperlipidemia    Hypertension     ROS:   All systems reviewed and negative except as noted in the HPI.   Past Surgical History:  Procedure Laterality Date   ANTERIOR CERVICAL DECOMP/DISCECTOMY FUSION N/A 05/16/2020   Procedure: ANTERIOR CERVICAL DECOMPRESSION/DISCECTOMY FUSION CERVICAL FIVE THROUGH CERVICAL SEVEN;  Surgeon: Venita Lick, MD;  Location: [facility] OR;  Service: Orthopedics;  Laterality: N/A;  3.5 hrs Needs pre op anesthesia consult-doesn't have a PCP   CORONARY STENT INTERVENTION N/A 09/20/2020   Procedure: CORONARY STENT INTERVENTION;  Surgeon: Swaziland, Peter M, MD;  Location: [facility] INVASIVE CV LAB;  Service: Cardiovascular;  Laterality: N/A;   HEMATOMA EVACUATION N/A 05/16/2020   Procedure: EVACUATION HEMATOMA;  Surgeon: Venita Lick, MD;  Location: [facility] OR;  Service: Orthopedics;  Laterality: N/A;   LEFT HEART CATH AND CORONARY ANGIOGRAPHY N/A 09/20/2020   Procedure: LEFT HEART CATH AND CORONARY ANGIOGRAPHY;  Surgeon: Swaziland, Peter M, MD;  Location: [facility] INVASIVE CV LAB;  Service: Cardiovascular;  Laterality: N/A;   TONSILLECTOMY     chilhood   TOTAL HIP ARTHROPLASTY Right 11/06/2016   Procedure: RIGHT TOTAL HIP ARTHROPLASTY ANTERIOR APPROACH;  Surgeon: Kathryne Hitch, MD;  Location: [facility] ORS;  Service: Orthopedics;  Laterality: Right;     Family History  Problem Relation Age of Onset   Cancer Mother    CAD Neg Hx      Social History   Socioeconomic History   Marital status: Married    Spouse name: Not on file   Number of children: Not on file   Years of education: Not on file   Highest education level: Not on  file  Occupational History   Not on file  Tobacco Use   Smoking status: Former    Packs/day: 1.00    Years: 40.00    Pack years: 40.00    Types: Cigarettes    Quit date: 04/2020    Years since quitting: 1.0   Smokeless tobacco: Never  Vaping Use   Vaping Use: Never used  Substance and Sexual Activity   Alcohol use: Yes    Comment: half a pint of liquor Q3 days    Drug use: No   Sexual activity: Yes  Other Topics Concern   Not on file  Social History Narrative   Not on file   Social Determinants of Health   Financial Resource Strain: Not on file  Food Insecurity: Not on file  Transportation Needs: Not on file  Physical Activity: Not on file  Stress: Not on file  Social Connections: Not on file  Intimate Partner Violence: Not on file     BP 132/78   Pulse (!) 36   Ht 5' 10.5" (1.791 m)   Wt 253 lb 9.6 oz (115 kg)   SpO2 97%   BMI 35.87 kg/m   Physical Exam:  Well appearing middle aged man, NAD HEENT: Unremarkable Neck:  No JVD, no thyromegally Lymphatics:  No adenopathy Back:  No CVA tenderness Lungs:  Clear with no wheezes HEART:  IRegular rate rhythm, no murmurs, no rubs, no clicks Abd:  soft, positive bowel sounds, no organomegally, no rebound, no guarding Ext:  2 plus pulses, no edema, no cyanosis, no clubbing Skin:  No rashes no nodules Neuro:  CN II through XII intact, motor grossly intact   Assess/Plan:  PVC's - his symptoms have improved. I have asked him to continue the mexitil and to wear another cardiac monitor. Hopefully his PVC burden will be much reduced. CAD - he denies chest pain. He will continue plavix for another 4 months. HTN - his bp has been reasonably well controlled.  Tobacco abuse - he is back to smoking a half pack a day. He has been encouraged to stop smoking.  Sharlot Gowda Braxson Hollingsworth,MD

## 2021-05-04 ENCOUNTER — Other Ambulatory Visit: Payer: Self-pay | Admitting: Cardiology

## 2021-08-03 ENCOUNTER — Other Ambulatory Visit: Payer: Self-pay | Admitting: Cardiology

## 2021-08-29 ENCOUNTER — Telehealth: Payer: Self-pay | Admitting: Cardiology

## 2021-08-29 NOTE — Telephone Encounter (Signed)
FYI

## 2021-08-29 NOTE — Telephone Encounter (Signed)
Talked with patient that Dr. Swaziland only had availability in March. Patient was not happy and want to talk with Dr. Swaziland or nurse only

## 2021-09-01 NOTE — Telephone Encounter (Signed)
Let's shoot for something in Jan  PJ

## 2021-09-02 NOTE — Telephone Encounter (Signed)
Spoke to patient appointment scheduled with Dr.Jordan 12/5 at 1:20 pm.

## 2021-09-14 ENCOUNTER — Other Ambulatory Visit: Payer: Self-pay | Admitting: Cardiology

## 2021-09-17 ENCOUNTER — Telehealth: Payer: Self-pay | Admitting: Cardiology

## 2021-09-17 MED ORDER — METOPROLOL TARTRATE 25 MG PO TABS: 25.0000 mg | ORAL_TABLET | Freq: Two times a day (BID) | ORAL | 3 refills | Status: DC

## 2021-09-17 NOTE — Telephone Encounter (Signed)
*  STAT* If patient is at the pharmacy, call can be transferred to refill team.   1. Which medications need to be refilled? (please list name of each medication and dose if known) metoprolol tartrate (LOPRESSOR) 25 MG tablet  2. Which pharmacy/location (including street and city if local pharmacy) is medication to be sent to? [id number] - [place], [place] - [address]  3. Do they need a 30 day or 90 day supply? 30 day  Patient is out of medication.

## 2021-09-17 NOTE — Telephone Encounter (Signed)
Medication sent electronically 

## 2021-09-24 ENCOUNTER — Other Ambulatory Visit: Payer: Self-pay | Admitting: Internal Medicine

## 2021-09-27 NOTE — Progress Notes (Signed)
Cardiology Office Note:    Date:  09/29/2021   ID:  Preston Ortiz, DOB [birth year], MRN [id number]  PCP:  Patient, No Pcp Per (Inactive)  [facility] Cardiologist:  Lewayne Bunting, MD  [facility] Electrophysiologist:  None   Referring MD: No ref. provider found   Chief Complaint  Patient presents with   Coronary Artery Disease     History of Present Illness:    Preston Ortiz is a 65 y.o. male with a hx of CAD, COPD, hyperlipidemia and hypertension.  He recently presented in November 2021 for chest pain, troponin was greater than 23,000.  EKG shows sinus rhythm with right bundle branch block.  Cardiac catheterization showed 100% proximal and mid RCA occlusion with collaterals, stenosis was treated with drug-eluting stent.  He also had 100% occlusion of small RPL 1 with collaterals, this was managed medically.  Echocardiogram showed EF 50 to 55% with inferobasal hypokinesis, grade 1 DD, no significant valve disease.  He was discharged on aspirin and Brilinta.  Due to the cost of Brilinta, he was switched to Plavix.  Smoking cessation encouraged. Amlodipine was added during the last visit due to elevated blood pressure.  Lipitor was switched to Crestor due to side effect.  When last seen by Azalee Course PA-C he was noted to have frequent PVCs with a artificially low pulse count. Event monitor was done showing a high PVC burden 39% with multiple morphologies. He was seen by Dr Ladona Ridgel in April who recommended a trial of Mexitil. Dose has increased to 300 mg bid. He notes that since then his palpitations went away.  He does smoke occasionally. He was seen by Dr Ladona Ridgel in July. Repeat event monitor showed a reduction in PVC burden to 5.6%.   On follow up today he denies any cardiac complaints. No chest pain, SOB, palpitations. He is still smoking. Notes erectile dysfunction. Has chronic neck problems since accident in 2020.   Past Medical History:  Diagnosis Date   Arthritis     osteoathritis   Coronary artery disease    NSTEMI November 2021. DES of RCA   GERD (gastroesophageal reflux disease)    ocassional   Hyperlipidemia    Hypertension     Past Surgical History:  Procedure Laterality Date   ANTERIOR CERVICAL DECOMP/DISCECTOMY FUSION N/A 05/16/2020   Procedure: ANTERIOR CERVICAL DECOMPRESSION/DISCECTOMY FUSION CERVICAL FIVE THROUGH CERVICAL SEVEN;  Surgeon: Venita Lick, MD;  Location: [facility] OR;  Service: Orthopedics;  Laterality: N/A;  3.5 hrs Needs pre op anesthesia consult-doesn't have a PCP   CORONARY STENT INTERVENTION N/A 09/20/2020   Procedure: CORONARY STENT INTERVENTION;  Surgeon: Swaziland, Rikki Smestad M, MD;  Location: [facility] INVASIVE CV LAB;  Service: Cardiovascular;  Laterality: N/A;   HEMATOMA EVACUATION N/A 05/16/2020   Procedure: EVACUATION HEMATOMA;  Surgeon: Venita Lick, MD;  Location: [facility] OR;  Service: Orthopedics;  Laterality: N/A;   LEFT HEART CATH AND CORONARY ANGIOGRAPHY N/A 09/20/2020   Procedure: LEFT HEART CATH AND CORONARY ANGIOGRAPHY;  Surgeon: Swaziland, Allina Riches M, MD;  Location: [facility] INVASIVE CV LAB;  Service: Cardiovascular;  Laterality: N/A;   TONSILLECTOMY     chilhood   TOTAL HIP ARTHROPLASTY Right 11/06/2016   Procedure: RIGHT TOTAL HIP ARTHROPLASTY ANTERIOR APPROACH;  Surgeon: Kathryne Hitch, MD;  Location: [facility] ORS;  Service: Orthopedics;  Laterality: Right;    Current Medications: Current Meds  Medication Sig   amLODipine (NORVASC) 5 MG tablet Take 1 tablet by mouth once daily   aspirin EC 81  MG EC tablet Take 1 tablet (81 mg total) by mouth daily. Swallow whole.   metoprolol tartrate (LOPRESSOR) 25 MG tablet Take 1 tablet (25 mg total) by mouth 2 (two) times daily.   mexiletine (MEXITIL) 150 MG capsule Take 2 capsules (300 mg total) by mouth 2 (two) times daily. Pt needs to keep upcoming appt in Dec for further refills   rosuvastatin (CRESTOR) 40 MG tablet Take 1 tablet by mouth once daily   sildenafil (REVATIO) 20 MG tablet Take 1  tablet (20 mg total) by mouth 3 (three) times daily.   traZODone (DESYREL) 50 MG tablet TAKE 1/2 (ONE-HALF) TABLET BY MOUTH ONCE DAILY AS NEEDED   [DISCONTINUED] clopidogrel (PLAVIX) 75 MG tablet Take 1 tablet (75 mg total) by mouth daily.     Allergies:   Cyclobenzaprine, Gabapentin, Lipitor [atorvastatin], and Penicillins   Social History   Socioeconomic History   Marital status: Married    Spouse name: Not on file   Number of children: Not on file   Years of education: Not on file   Highest education level: Not on file  Occupational History   Not on file  Tobacco Use   Smoking status: Former    Packs/day: 1.00    Years: 40.00    Pack years: 40.00    Types: Cigarettes    Quit date: 04/2020    Years since quitting: 1.4   Smokeless tobacco: Never  Vaping Use   Vaping Use: Never used  Substance and Sexual Activity   Alcohol use: Yes    Comment: half a pint of liquor Q3 days    Drug use: No   Sexual activity: Yes  Other Topics Concern   Not on file  Social History Narrative   Not on file   Social Determinants of Health   Financial Resource Strain: Not on file  Food Insecurity: Not on file  Transportation Needs: Not on file  Physical Activity: Not on file  Stress: Not on file  Social Connections: Not on file     Family History: The patient's family history includes Cancer in his mother. There is no history of CAD.  ROS:   Please see the history of present illness.     All other systems reviewed and are negative.  EKGs/Labs/Other Studies Reviewed:    The following studies were reviewed today:  Echo 09/20/2020 1. Left ventricular ejection fraction, by estimation, is 50 to 55%. The  left ventricle has low normal function. The left ventricle demonstrates  regional wall motion abnormalities (see scoring diagram/findings for  description). Left ventricular diastolic   parameters are consistent with Grade I diastolic dysfunction (impaired  relaxation). There is  moderate hypokinesis of the left ventricular, basal  inferoseptal wall, inferior wall and inferolateral wall.   2. Right ventricular systolic function is normal. The right ventricular  size is normal.   3. The mitral valve is normal in structure. No evidence of mitral valve  regurgitation. No evidence of mitral stenosis.   4. The aortic valve is normal in structure. Aortic valve regurgitation is  not visualized. No aortic stenosis is present.   5. The inferior vena cava is dilated in size with <50% respiratory  variability, suggesting right atrial pressure of 15 mmHg.    Cath 09/20/2020 Mid LAD to Dist LAD lesion is 30% stenosed. Prox RCA to Mid RCA lesion is 100% stenosed. 1st RPL lesion is 100% stenosed. Post intervention, there is a 0% residual stenosis. A drug-eluting stent was successfully placed  using a STENT RESOLUTE ONYX 3.0X26. The left ventricular systolic function is normal. LV end diastolic pressure is normal. The left ventricular ejection fraction is 55-65% by visual estimate.   1. 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 2. Normal LV function 3. Normal LVEDP 4. Successful PCI of the proximal RCA with DES x1.    Plan: DAPT for one year. Anticipate DC tomorrow.   Event monitor 12/25/20: Study Highlights    NSR wth frequent PVCs, occasional PVC coulets and triplets. High PVC burden 39%.   Event monitor 05/13/21: 1. NSR with sinus bradycardia 2. Occaisional PAC's and frequent PVC's. 3. No VT or SVT 4. No atrial fib or flutter   [name],MD   Patch Wear Time:  3 days and 0 hours (2022-07-05T18:07:17-0400 to 2022-07-08T18:07:47-0400)   Patient had a min HR of 43 bpm, max HR of 88 bpm, and avg HR of 58 bpm. Predominant underlying rhythm was Sinus Rhythm. Bundle Branch Block/IVCD was present. Isolated SVEs were rare (<1.0%), SVE Couplets were rare (<1.0%), and no SVE Triplets were  present. Isolated VEs were frequent (5.6%, 13677),  VE Couplets were rare (<1.0%, 313), and VE Triplets were rare (<1.0%, 1). Ventricular Bigeminy and Trigeminy were present.  EKG:  EKG is not  ordered today.   Recent Labs: 11/26/2020: ALT 24; BUN 14; Creatinine, Ser 1.09; Potassium 5.1; Sodium 139  Recent Lipid Panel    Component Value Date/Time   CHOL 92 (L) 11/26/2020 0831   TRIG 128 11/26/2020 0831   HDL 32 (L) 11/26/2020 0831   CHOLHDL 2.9 11/26/2020 0831   CHOLHDL 4.5 09/20/2020 0207   VLDL 24 09/20/2020 0207   LDLCALC 37 11/26/2020 0831     Risk Assessment/Calculations:       Physical Exam:    VS:  BP (!) 149/105   Pulse (!) 57   Ht 5' 10.5" (1.791 m)   Wt 262 lb 9.6 oz (119.1 kg)   SpO2 99%   BMI 37.15 kg/m     Wt Readings from Last 3 Encounters:  09/29/21 262 lb 9.6 oz (119.1 kg)  04/29/21 253 lb 9.6 oz (115 kg)  03/03/21 250 lb (113.4 kg)     GEN:  Well nourished, well developed in no acute distress HEENT: Normal NECK: No JVD; No carotid bruits LYMPHATICS: No lymphadenopathy CARDIAC: RRR, no murmurs, rubs, gallops RESPIRATORY:  Clear to auscultation without rales, wheezing or rhonchi  ABDOMEN: Soft, non-tender, non-distended MUSCULOSKELETAL:  No edema; No deformity  SKIN: Warm and dry NEUROLOGIC:  Alert and oriented x 3 PSYCHIATRIC:  Normal affect   ASSESSMENT:    1. Coronary artery disease involving native coronary artery of native heart with other form of angina pectoris (HCC)   2. PVC (premature ventricular contraction)   3. Hyperlipidemia with target LDL less than 70   4. Tobacco use   5. Primary hypertension     PLAN:    In order of problems listed above:  CAD s/p STEMI in 2021 with DES of RCA: Denies any chest pain.  May discontinue Plavix now. Continue ASA, metoprolol, statin.  PVCs: High burden with multiple morphologies. Seen by Dr Ladona Ridgel. Now on Mexitil. Tolerating well. Excellent response with reduction in burden from 39>>5%.   COPD: No acute exacerbation  Hypertension: well  controlled. Continue metoprolol  Hyperlipidemia: On Crestor 40 mg daily. Excellent control  6.   Tobacco use recommend complete cessation.   7.   ED will give trial of Viagra.  Medication Adjustments/Labs and Tests Ordered: Current medicines are reviewed at length with the patient today.  Concerns regarding medicines are outlined above.  No orders of the defined types were placed in this encounter.  Meds ordered this encounter  Medications   sildenafil (REVATIO) 20 MG tablet    Sig: Take 1 tablet (20 mg total) by mouth 3 (three) times daily.    Dispense:  10 tablet    Refill:  3     Patient Instructions  You may stop clopidogrel (Plavix)  You can try Viagra 20 to 60 mg prn erectile dysfunction  Quit smoking completely.   Follow up in 6 months   Signed, Dilpreet Faires Swaziland, MD  09/29/2021 1:25 PM    [facility]

## 2021-09-29 ENCOUNTER — Other Ambulatory Visit: Payer: Self-pay

## 2021-09-29 ENCOUNTER — Encounter: Payer: Self-pay | Admitting: Cardiology

## 2021-09-29 ENCOUNTER — Ambulatory Visit (INDEPENDENT_AMBULATORY_CARE_PROVIDER_SITE_OTHER): Payer: Self-pay | Admitting: Cardiology

## 2021-09-29 VITALS — BP 149/105 | HR 57 | Ht 70.5 in | Wt 262.6 lb

## 2021-09-29 DIAGNOSIS — I25118 Atherosclerotic heart disease of native coronary artery with other forms of angina pectoris: Secondary | ICD-10-CM

## 2021-09-29 DIAGNOSIS — I10 Essential (primary) hypertension: Secondary | ICD-10-CM

## 2021-09-29 DIAGNOSIS — E785 Hyperlipidemia, unspecified: Secondary | ICD-10-CM

## 2021-09-29 DIAGNOSIS — Z720 Tobacco use: Secondary | ICD-10-CM

## 2021-09-29 DIAGNOSIS — I493 Ventricular premature depolarization: Secondary | ICD-10-CM

## 2021-09-29 MED ORDER — SILDENAFIL CITRATE 20 MG PO TABS: 20.0000 mg | ORAL_TABLET | Freq: Three times a day (TID) | ORAL | 3 refills | Status: DC

## 2021-09-29 NOTE — Patient Instructions (Addendum)
You may stop clopidogrel (Plavix)  You can try Viagra 20 to 60 mg prn erectile dysfunction  Quit smoking completely.   Follow up in 6 months

## 2021-10-02 ENCOUNTER — Telehealth: Payer: Self-pay | Admitting: Cardiology

## 2021-10-02 NOTE — Telephone Encounter (Signed)
Called and spoke with pt, all questions answered. Not further questions.

## 2021-10-02 NOTE — Telephone Encounter (Signed)
Pt c/o medication issue:  1. Name of Medication: sildenafil (REVATIO) 20 MG tablet  2. How are you currently taking this medication (dosage and times per day)?   3. Are you having a reaction (difficulty breathing--STAT)?   4. What is your medication issue? PT WANTS TO KNOW IS THIS MEDICINE JUST A SAMPLE

## 2021-10-23 ENCOUNTER — Other Ambulatory Visit: Payer: Self-pay | Admitting: Internal Medicine

## 2022-01-24 IMAGING — CR DG CHEST 2V
2 series · 2 of 2 positions shown · non-contrast
Comparison: No priors.

CLINICAL DATA: 63-year-old male under preoperative evaluation prior
to anterior cervical discectomy infusion.

EXAM:
CHEST - 2 VIEW

[w chest pa]
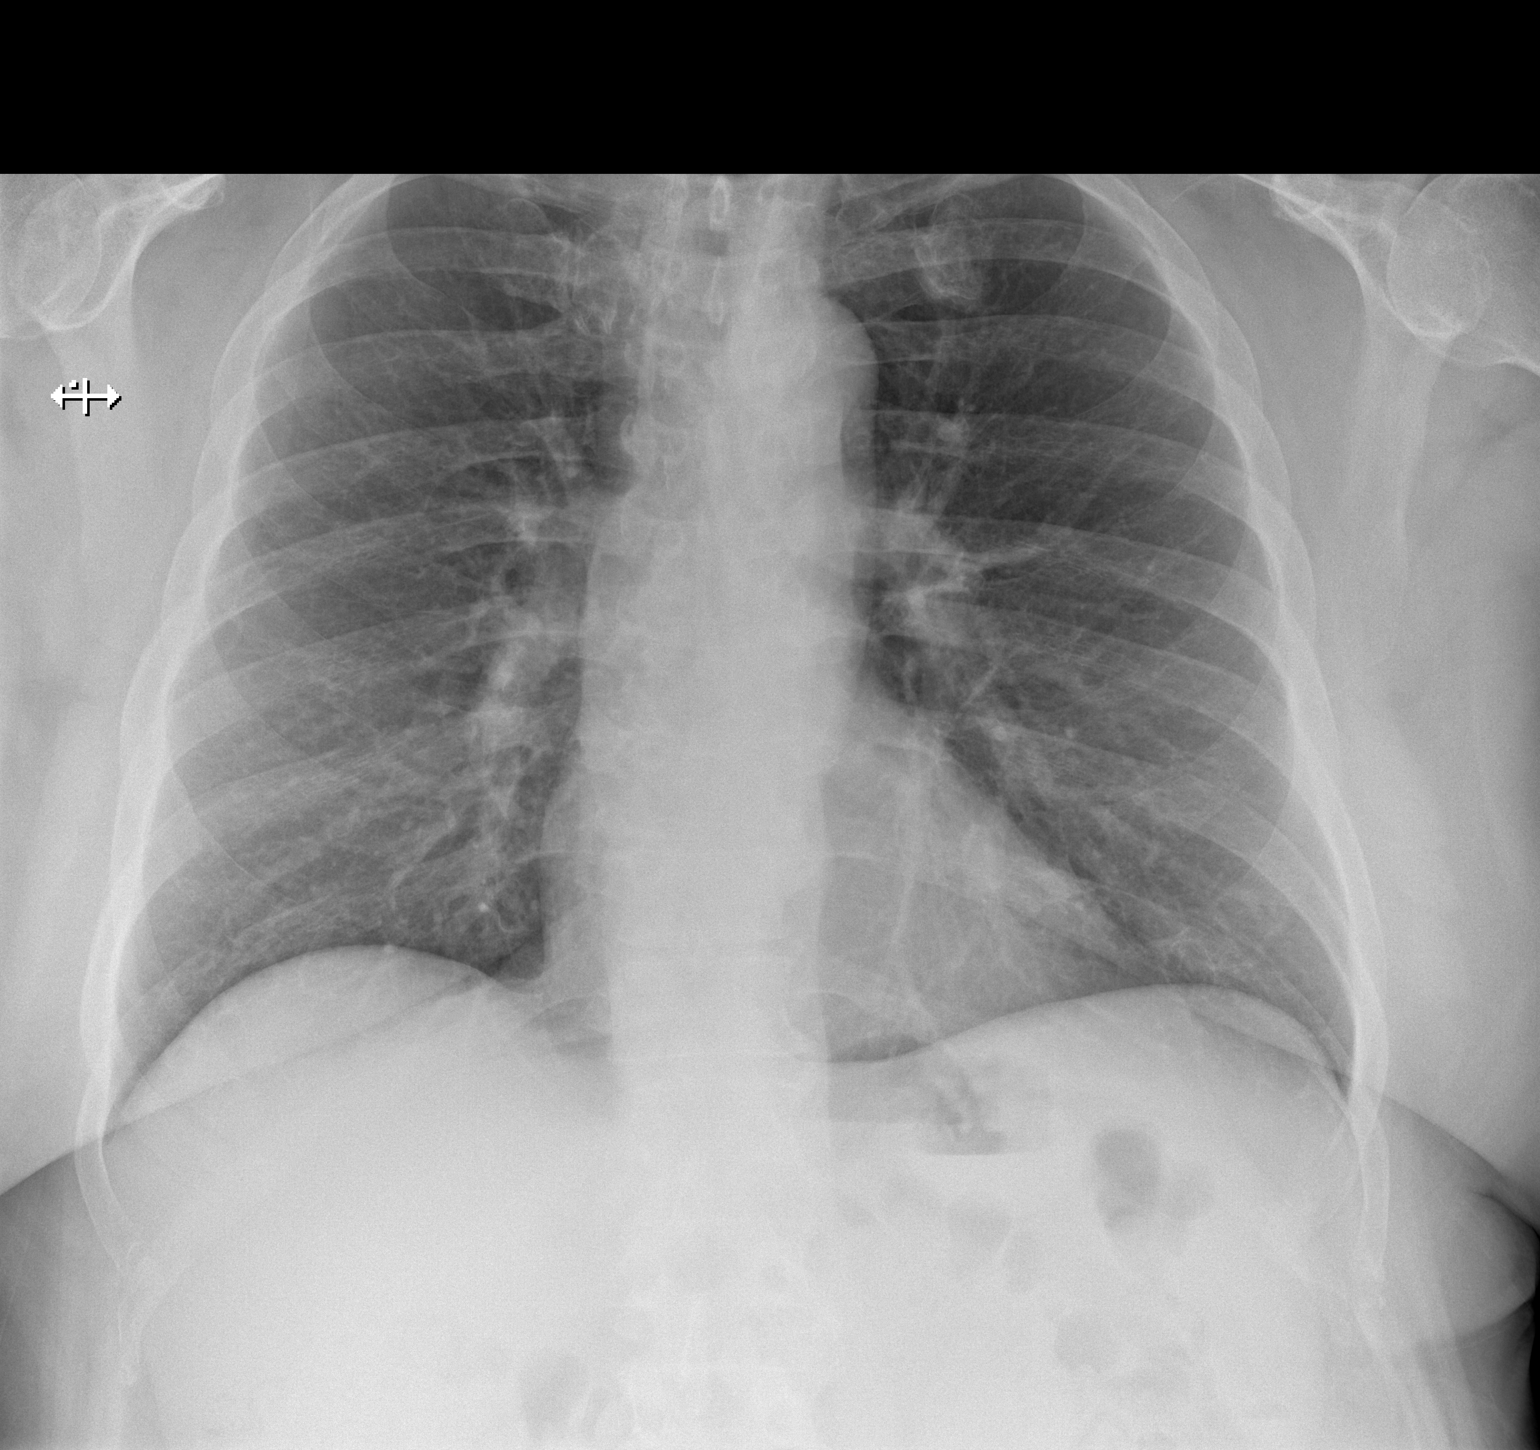

[w chest lat]
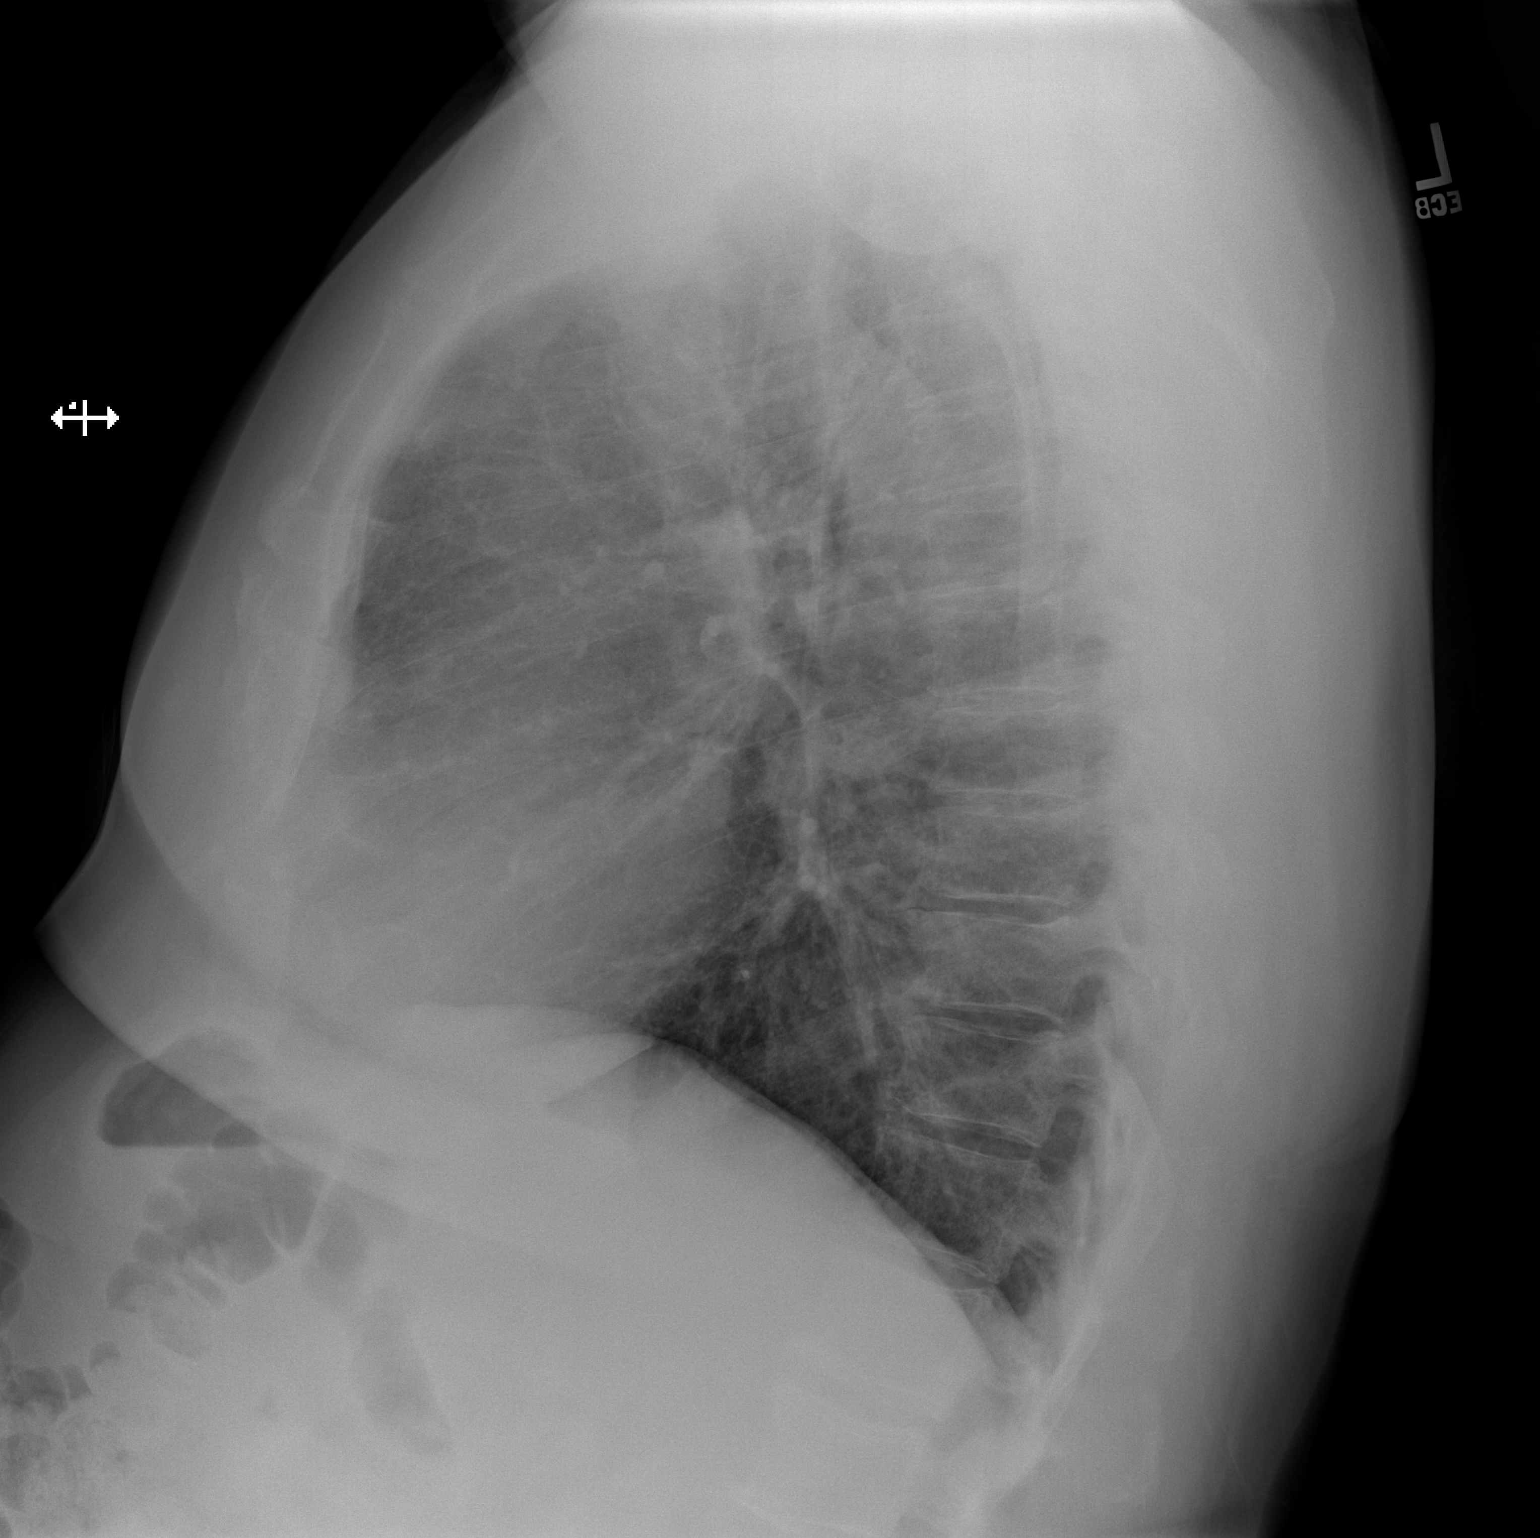

[2 of 2 positions shown; findings below may reference images not displayed]

FINDINGS: Lung volumes are normal. No consolidative airspace disease. No
pleural effusions. No pneumothorax. No pulmonary nodule or mass
noted. Pulmonary vasculature and the cardiomediastinal silhouette
are within normal limits.
IMPRESSION: No radiographic evidence of acute cardiopulmonary disease.

## 2022-03-25 NOTE — Progress Notes (Signed)
Cardiology Office Note:    Date:  03/30/2022   ID:  Preston Ortiz, DOB January 14, 1956, MRN [id number]  PCP:  Patient, No Pcp Per (Inactive)  [facility] Cardiologist:  Lewayne Bunting, MD  [facility] Electrophysiologist:  None   Referring MD: No ref. provider found   Chief Complaint  Patient presents with   Coronary Artery Disease     History of Present Illness:    Preston Ortiz is a 66 y.o. male with a hx of CAD, COPD, hyperlipidemia and hypertension.  He presented in November 2021 for chest pain, troponin was greater than 23,000.  EKG showed sinus rhythm with right bundle branch block.  Cardiac catheterization showed 100% proximal and mid RCA occlusion with collaterals, stenosis was treated with drug-eluting stent.  He also had 100% occlusion of small RPL 1 with collaterals, this was managed medically.  Echocardiogram showed EF 50 to 55% with inferobasal hypokinesis, grade 1 DD, no significant valve disease.  Took DAPT for one year. Smoking cessation encouraged. Amlodipine was added during the last visit due to elevated blood pressure.  Lipitor was switched to Crestor due to side effect.  When last seen by Azalee Course PA-C he was noted to have frequent PVCs with a artificially low pulse count. Event monitor was done showing a high PVC burden 39% with multiple morphologies. He was seen by Dr Ladona Ridgel in April who recommended a trial of Mexitil. Dose has increased to 300 mg bid. He notes that since then his palpitations went away.  He does smoke occasionally. He was seen by Dr Ladona Ridgel in July 2022. Repeat event monitor showed a reduction in PVC burden to 5.6%.   On follow up today he denies any cardiac complaints. No chest pain, SOB, palpitations. He is still smoking. Trying to quit. Notes erectile dysfunction. Has chronic neck problems since accident in 2020.    Past Medical History:  Diagnosis Date   Arthritis    osteoathritis   Coronary artery disease    NSTEMI November 2021.  DES of RCA   GERD (gastroesophageal reflux disease)    ocassional   Hyperlipidemia    Hypertension     Past Surgical History:  Procedure Laterality Date   ANTERIOR CERVICAL DECOMP/DISCECTOMY FUSION N/A 05/16/2020   Procedure: ANTERIOR CERVICAL DECOMPRESSION/DISCECTOMY FUSION CERVICAL FIVE THROUGH CERVICAL SEVEN;  Surgeon: Venita Lick, MD;  Location: [facility] OR;  Service: Orthopedics;  Laterality: N/A;  3.5 hrs Needs pre op anesthesia consult-doesn't have a PCP   CORONARY STENT INTERVENTION N/A 09/20/2020   Procedure: CORONARY STENT INTERVENTION;  Surgeon: Swaziland, Merryl Buckels M, MD;  Location: [facility] INVASIVE CV LAB;  Service: Cardiovascular;  Laterality: N/A;   HEMATOMA EVACUATION N/A 05/16/2020   Procedure: EVACUATION HEMATOMA;  Surgeon: Venita Lick, MD;  Location: [facility] OR;  Service: Orthopedics;  Laterality: N/A;   LEFT HEART CATH AND CORONARY ANGIOGRAPHY N/A 09/20/2020   Procedure: LEFT HEART CATH AND CORONARY ANGIOGRAPHY;  Surgeon: Swaziland, Trianna Lupien M, MD;  Location: [facility] INVASIVE CV LAB;  Service: Cardiovascular;  Laterality: N/A;   TONSILLECTOMY     chilhood   TOTAL HIP ARTHROPLASTY Right 11/06/2016   Procedure: RIGHT TOTAL HIP ARTHROPLASTY ANTERIOR APPROACH;  Surgeon: Kathryne Hitch, MD;  Location: [facility] ORS;  Service: Orthopedics;  Laterality: Right;    Current Medications: Current Meds  Medication Sig   amLODipine (NORVASC) 5 MG tablet Take 1 tablet by mouth once daily   aspirin EC 81 MG EC tablet Take 1 tablet (81 mg total) by mouth  daily. Swallow whole.   metoprolol tartrate (LOPRESSOR) 25 MG tablet Take 1 tablet (25 mg total) by mouth 2 (two) times daily.   mexiletine (MEXITIL) 150 MG capsule Take 2 capsules by mouth twice daily   rosuvastatin (CRESTOR) 40 MG tablet Take 1 tablet by mouth once daily   [DISCONTINUED] sildenafil (REVATIO) 20 MG tablet Take 1 tablet (20 mg total) by mouth 3 (three) times daily.     Allergies:   Cyclobenzaprine, Gabapentin, Lipitor [atorvastatin], and  Penicillins   Social History   Socioeconomic History   Marital status: Married    Spouse name: Not on file   Number of children: Not on file   Years of education: Not on file   Highest education level: Not on file  Occupational History   Not on file  Tobacco Use   Smoking status: Former    Packs/day: 1.00    Years: 40.00    Pack years: 40.00    Types: Cigarettes    Quit date: 04/2020    Years since quitting: 1.9   Smokeless tobacco: Never  Vaping Use   Vaping Use: Never used  Substance and Sexual Activity   Alcohol use: Yes    Comment: half a pint of liquor Q3 days    Drug use: No   Sexual activity: Yes  Other Topics Concern   Not on file  Social History Narrative   Not on file   Social Determinants of Health   Financial Resource Strain: Not on file  Food Insecurity: Not on file  Transportation Needs: Not on file  Physical Activity: Not on file  Stress: Not on file  Social Connections: Not on file     Family History: The patient's family history includes Cancer in his mother. There is no history of CAD.  ROS:   Please see the history of present illness.     All other systems reviewed and are negative.  EKGs/Labs/Other Studies Reviewed:    The following studies were reviewed today:  Echo 09/20/2020 1. Left ventricular ejection fraction, by estimation, is 50 to 55%. The  left ventricle has low normal function. The left ventricle demonstrates  regional wall motion abnormalities (see scoring diagram/findings for  description). Left ventricular diastolic   parameters are consistent with Grade I diastolic dysfunction (impaired  relaxation). There is moderate hypokinesis of the left ventricular, basal  inferoseptal wall, inferior wall and inferolateral wall.   2. Right ventricular systolic function is normal. The right ventricular  size is normal.   3. The mitral valve is normal in structure. No evidence of mitral valve  regurgitation. No evidence of mitral  stenosis.   4. The aortic valve is normal in structure. Aortic valve regurgitation is  not visualized. No aortic stenosis is present.   5. The inferior vena cava is dilated in size with <50% respiratory  variability, suggesting right atrial pressure of 15 mmHg.    Cath 09/20/2020 Mid LAD to Dist LAD lesion is 30% stenosed. Prox RCA to Mid RCA lesion is 100% stenosed. 1st RPL lesion is 100% stenosed. Post intervention, there is a 0% residual stenosis. A drug-eluting stent was successfully placed using a STENT RESOLUTE ONYX 3.0X26. The left ventricular systolic function is normal. LV end diastolic pressure is normal. The left ventricular ejection fraction is 55-65% by visual estimate.   1. 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 2. Normal LV function 3. Normal LVEDP 4. Successful PCI of the proximal RCA with DES x1.  Plan: DAPT for one year. Anticipate DC tomorrow.   Event monitor 12/25/20: Study Highlights    NSR wth frequent PVCs, occasional PVC coulets and triplets. High PVC burden 39%.   Event monitor 05/13/21: 1. NSR with sinus bradycardia 2. Occaisional PAC's and frequent PVC's. 3. No VT or SVT 4. No atrial fib or flutter   [name],MD   Patch Wear Time:  3 days and 0 hours (2022-07-05T18:07:17-0400 to 2022-07-08T18:07:47-0400)   Patient had a min HR of 43 bpm, max HR of 88 bpm, and avg HR of 58 bpm. Predominant underlying rhythm was Sinus Rhythm. Bundle Branch Block/IVCD was present. Isolated SVEs were rare (<1.0%), SVE Couplets were rare (<1.0%), and no SVE Triplets were  present. Isolated VEs were frequent (5.6%, 13677), VE Couplets were rare (<1.0%, 313), and VE Triplets were rare (<1.0%, 1). Ventricular Bigeminy and Trigeminy were present.  EKG:  EKG is  ordered today. NSR rate 56, Normal Ecg. Since prior tracing RBBB resolved.. I have personally reviewed and interpreted this study.   Recent Labs: No results found for  requested labs within last 8760 hours.  Recent Lipid Panel    Component Value Date/Time   CHOL 92 (L) 11/26/2020 0831   TRIG 128 11/26/2020 0831   HDL 32 (L) 11/26/2020 0831   CHOLHDL 2.9 11/26/2020 0831   CHOLHDL 4.5 09/20/2020 0207   VLDL 24 09/20/2020 0207   LDLCALC 37 11/26/2020 0831     Risk Assessment/Calculations:       Physical Exam:    VS:  BP 130/76   Pulse (!) 56   Ht 5\' 10"  (1.778 m)   Wt 262 lb 9.6 oz (119.1 kg)   SpO2 97%   BMI 37.68 kg/m     Wt Readings from Last 3 Encounters:  03/30/22 262 lb 9.6 oz (119.1 kg)  09/29/21 262 lb 9.6 oz (119.1 kg)  04/29/21 253 lb 9.6 oz (115 kg)     GEN:  Well nourished, well developed in no acute distress HEENT: Normal NECK: No JVD; No carotid bruits LYMPHATICS: No lymphadenopathy CARDIAC: RRR, no murmurs, rubs, gallops RESPIRATORY:  Clear to auscultation without rales, wheezing or rhonchi  ABDOMEN: Soft, non-tender, non-distended MUSCULOSKELETAL:  No edema; No deformity  SKIN: Warm and dry NEUROLOGIC:  Alert and oriented x 3 PSYCHIATRIC:  Normal affect   ASSESSMENT:    1. Coronary artery disease involving native coronary artery of native heart with other form of angina pectoris (HCC)   2. PVC (premature ventricular contraction)   3. Hyperlipidemia with target LDL less than 70   4. Primary hypertension   5. Tobacco use      PLAN:    In order of problems listed above:  CAD s/p STEMI in 2021 with DES of RCA: Denies any chest pain.  Continue ASA, metoprolol, statin.  PVCs: High burden with multiple morphologies. Seen by Dr 2022. Now on Mexitil. Tolerating well. Excellent response with reduction in burden from 39>>5%.   COPD: No acute exacerbation  Hypertension: well controlled. Continue metoprolol/amlodipine.   Hyperlipidemia: On Crestor 40 mg daily. Follow up labs today  6.   Tobacco use recommend complete cessation.   7.   ED refilled sildenifil. Check testosterone level.         Medication  Adjustments/Labs and Tests Ordered: Current medicines are reviewed at length with the patient today.  Concerns regarding medicines are outlined above.  Orders Placed This Encounter  Procedures   EKG 12-Lead    Meds ordered this encounter  Medications   sildenafil (REVATIO) 20 MG tablet    Sig: Take 1 tablet (20 mg total) by mouth daily as needed (erectile dysfunction).    Dispense:  50 tablet    Refill:  3        Signed, Coletta Lockner Sw[place]and, MD  03/30/2022 1:29 PM    [facility]

## 2022-03-30 ENCOUNTER — Encounter: Payer: Self-pay | Admitting: Cardiology

## 2022-03-30 ENCOUNTER — Ambulatory Visit (INDEPENDENT_AMBULATORY_CARE_PROVIDER_SITE_OTHER): Payer: Self-pay | Admitting: Cardiology

## 2022-03-30 VITALS — BP 130/76 | HR 56 | Ht 70.0 in | Wt 262.6 lb

## 2022-03-30 DIAGNOSIS — Z720 Tobacco use: Secondary | ICD-10-CM

## 2022-03-30 DIAGNOSIS — I493 Ventricular premature depolarization: Secondary | ICD-10-CM

## 2022-03-30 DIAGNOSIS — I25118 Atherosclerotic heart disease of native coronary artery with other forms of angina pectoris: Secondary | ICD-10-CM

## 2022-03-30 DIAGNOSIS — I10 Essential (primary) hypertension: Secondary | ICD-10-CM

## 2022-03-30 DIAGNOSIS — E785 Hyperlipidemia, unspecified: Secondary | ICD-10-CM

## 2022-03-30 MED ORDER — SILDENAFIL CITRATE 20 MG PO TABS: 20.0000 mg | ORAL_TABLET | Freq: Every day | ORAL | 3 refills | Status: DC | PRN

## 2022-03-30 NOTE — Patient Instructions (Signed)
Medication Instructions:  Your physician recommends that you continue on your current medications as directed. Please refer to the Current Medication list given to you today.  *If you need a refill on your cardiac medications before your next appointment, please call your pharmacy*   Lab Work: Your physician recommends that you have labs drawn today: CMET, Mag, Lipids, and testosterone   If you have labs (blood work) drawn today and your tests are completely normal, you will receive your results only by: MyChart Message (if you have MyChart) OR A paper copy in the mail If you have any lab test that is abnormal or we need to change your treatment, we will call you to review the results.   Follow-Up: At [facility], you and your health needs are our priority.  As part of our continuing mission to provide you with exceptional heart care, we have created designated Provider Care Teams.  These Care Teams include your primary Cardiologist (physician) and Advanced Practice Providers (APPs -  Physician Assistants and Nurse Practitioners) who all work together to provide you with the care you need, when you need it.  We recommend signing up for the patient portal called "MyChart".  Sign up information is provided on this After Visit Summary.  MyChart is used to connect with patients for Virtual Visits (Telemedicine).  Patients are able to view lab/test results, encounter notes, upcoming appointments, etc.  Non-urgent messages can be sent to your provider as well.   To learn more about what you can do with MyChart, go to [email or web address].    Your next appointment:   6 month(s)  The format for your next appointment:   In Person  Provider:   Peter Swaziland, MD

## 2022-03-31 LAB — COMPREHENSIVE METABOLIC PANEL
ALT: 26 IU/L (ref 0–44)
AST: 20 IU/L (ref 0–40)
Albumin/Globulin Ratio: 1.6 (ref 1.2–2.2)
Albumin: 4.5 g/dL (ref 3.8–4.8)
Alkaline Phosphatase: 88 IU/L (ref 44–121)
BUN/Creatinine Ratio: 16 (ref 10–24)
BUN: 16 mg/dL (ref 8–27)
Bilirubin Total: 0.3 mg/dL (ref 0.0–1.2)
CO2: 25 mmol/L (ref 20–29)
Calcium: 9.5 mg/dL (ref 8.6–10.2)
Chloride: 103 mmol/L (ref 96–106)
Creatinine, Ser: 1.02 mg/dL (ref 0.76–1.27)
Globulin, Total: 2.8 g/dL (ref 1.5–4.5)
Glucose: 77 mg/dL (ref 70–99)
Potassium: 5.4 mmol/L — ABNORMAL HIGH (ref 3.5–5.2)
Sodium: 141 mmol/L (ref 134–144)
Total Protein: 7.3 g/dL (ref 6.0–8.5)
eGFR: 82 mL/min/{1.73_m2} (ref >59)

## 2022-03-31 LAB — LIPID PANEL
Chol/HDL Ratio: 2.9 ratio (ref 0.0–5.0)
Cholesterol, Total: 106 mg/dL (ref 100–199)
HDL: 37 mg/dL — ABNORMAL LOW (ref >39)
LDL Chol Calc (NIH): 46 mg/dL (ref 0–99)
Triglycerides: 132 mg/dL (ref 0–149)
VLDL Cholesterol Cal: 23 mg/dL (ref 5–40)

## 2022-03-31 LAB — TESTOSTERONE: Testosterone: 203 ng/dL — ABNORMAL LOW (ref 264–916)

## 2022-03-31 LAB — MAGNESIUM: Magnesium: 2 mg/dL (ref 1.6–2.3)

## 2022-04-13 ENCOUNTER — Other Ambulatory Visit: Payer: Self-pay | Admitting: Internal Medicine

## 2022-04-23 ENCOUNTER — Other Ambulatory Visit: Payer: Self-pay | Admitting: Cardiology

## 2022-06-30 ENCOUNTER — Ambulatory Visit: Payer: Self-pay | Attending: Internal Medicine | Admitting: Internal Medicine

## 2022-06-30 ENCOUNTER — Encounter: Payer: Self-pay | Admitting: Internal Medicine

## 2022-06-30 VITALS — BP 148/92 | HR 64 | Ht 70.5 in | Wt 257.6 lb

## 2022-06-30 DIAGNOSIS — I493 Ventricular premature depolarization: Secondary | ICD-10-CM

## 2022-06-30 DIAGNOSIS — Z720 Tobacco use: Secondary | ICD-10-CM

## 2022-06-30 NOTE — Patient Instructions (Signed)
Medication Instructions:  Your physician recommends that you continue on your current medications as directed. Please refer to the Current Medication list given to you today.  *If you need a refill on your cardiac medications before your next appointment, please call your pharmacy*   Lab Work: NONE   If you have labs (blood work) drawn today and your tests are completely normal, you will receive your results only by: MyChart Message (if you have MyChart) OR A paper copy in the mail If you have any lab test that is abnormal or we need to change your treatment, we will call you to review the results.   Testing/Procedures: NONE    Follow-Up: At [facility], you and your health needs are our priority.  As part of our continuing mission to provide you with exceptional heart care, we have created designated Provider Care Teams.  These Care Teams include your primary Cardiologist (physician) and Advanced Practice Providers (APPs -  Physician Assistants and Nurse Practitioners) who all work together to provide you with the care you need, when you need it.  We recommend signing up for the patient portal called "MyChart".  Sign up information is provided on this After Visit Summary.  MyChart is used to connect with patients for Virtual Visits (Telemedicine).  Patients are able to view lab/test results, encounter notes, upcoming appointments, etc.  Non-urgent messages can be sent to your provider as well.   To learn more about what you can do with MyChart, go to [email or web address].    Your next appointment:   1 year(s)  The format for your next appointment:   In Person  Provider:   [name], MD    Other Instructions Thank you for choosing [facility]!    Important Information About Sugar       

## 2022-06-30 NOTE — Progress Notes (Signed)
HPI Mr. Nusz returns today for followup of his PVC's. He is a pleasant 66 yo with ongoing tobacco abuse despite AMI. He was found to have dense PVC's (36K in 24 hours) and was treated him with mexitil. A repeat heart monitor demonstrated 5% PVC's and he feels well. He denies chest pain or sob. Despite his late presentation his EF is only mildly decreased. He has developed some cervical spine problems and has been sedentary. He has undergone surgery. He had stopped smoking but now admits to smoking a pack a day.  Allergies  Allergen Reactions   Cyclobenzaprine     Blurred vision    Gabapentin     Bloody stools    Lipitor [Atorvastatin]     GI issues (diarrhea)    Penicillins     childhood allergy  Has patient had a PCN reaction causing immediate rash, facial/tongue/throat swelling, SOB or lightheadedness with hypotension: Unknown Has patient had a PCN reaction causing severe rash involving mucus membranes or skin necrosis: Unknown Has patient had a PCN reaction that required hospitalization Unknown Has patient had a PCN reaction occurring within the last 10 years: No If all of the above answers are "NO", then may proceed with Cephalosporin use.      Current Outpatient Medications  Medication Sig Dispense Refill   amLODipine (NORVASC) 5 MG tablet Take 1 tablet by mouth once daily 90 tablet 3   aspirin EC 81 MG EC tablet Take 1 tablet (81 mg total) by mouth daily. Swallow whole. 30 tablet 11   metoprolol tartrate (LOPRESSOR) 25 MG tablet Take 1 tablet (25 mg total) by mouth 2 (two) times daily. 180 tablet 3   mexiletine (MEXITIL) 150 MG capsule Take 2 capsules by mouth twice daily 180 capsule 1   rosuvastatin (CRESTOR) 40 MG tablet Take 1 tablet (40 mg total) by mouth daily. 60 tablet 2   sildenafil (REVATIO) 20 MG tablet Take 1 tablet (20 mg total) by mouth daily as needed (erectile dysfunction). 50 tablet 3   No current facility-administered medications for this visit.      Past Medical History:  Diagnosis Date   Arthritis    osteoathritis   Coronary artery disease    NSTEMI November 2021. DES of RCA   GERD (gastroesophageal reflux disease)    ocassional   Hyperlipidemia    Hypertension     ROS:   All systems reviewed and negative except as noted in the HPI.   Past Surgical History:  Procedure Laterality Date   ANTERIOR CERVICAL DECOMP/DISCECTOMY FUSION N/A 05/16/2020   Procedure: ANTERIOR CERVICAL DECOMPRESSION/DISCECTOMY FUSION CERVICAL FIVE THROUGH CERVICAL SEVEN;  Surgeon: Venita Lick, MD;  Location: [facility] OR;  Service: Orthopedics;  Laterality: N/A;  3.5 hrs Needs pre op anesthesia consult-doesn't have a PCP   CORONARY STENT INTERVENTION N/A 09/20/2020   Procedure: CORONARY STENT INTERVENTION;  Surgeon: Swaziland, Peter M, MD;  Location: [facility] INVASIVE CV LAB;  Service: Cardiovascular;  Laterality: N/A;   HEMATOMA EVACUATION N/A 05/16/2020   Procedure: EVACUATION HEMATOMA;  Surgeon: Venita Lick, MD;  Location: [facility] OR;  Service: Orthopedics;  Laterality: N/A;   LEFT HEART CATH AND CORONARY ANGIOGRAPHY N/A 09/20/2020   Procedure: LEFT HEART CATH AND CORONARY ANGIOGRAPHY;  Surgeon: Swaziland, Peter M, MD;  Location: [facility] INVASIVE CV LAB;  Service: Cardiovascular;  Laterality: N/A;   TONSILLECTOMY     chilhood   TOTAL HIP ARTHROPLASTY Right 11/06/2016   Procedure: RIGHT TOTAL HIP ARTHROPLASTY ANTERIOR APPROACH;  Surgeon:  Kathryne Hitch, MD;  Location: [facility] ORS;  Service: Orthopedics;  Laterality: Right;     Family History  Problem Relation Age of Onset   Cancer Mother    CAD Neg Hx      Social History   Socioeconomic History   Marital status: Married    Spouse name: Not on file   Number of children: Not on file   Years of education: Not on file   Highest education level: Not on file  Occupational History   Not on file  Tobacco Use   Smoking status: Former    Packs/day: 1.00    Years: 40.00    Total pack years: 40.00    Types:  Cigarettes    Quit date: 04/2020    Years since quitting: 2.1   Smokeless tobacco: Never  Vaping Use   Vaping Use: Never used  Substance and Sexual Activity   Alcohol use: Yes    Comment: half a pint of liquor Q3 days    Drug use: No   Sexual activity: Yes  Other Topics Concern   Not on file  Social History Narrative   Not on file   Social Determinants of Health   Financial Resource Strain: Not on file  Food Insecurity: Not on file  Transportation Needs: Not on file  Physical Activity: Not on file  Stress: Not on file  Social Connections: Not on file  Intimate Partner Violence: Not on file     BP (!) 148/92   Pulse 64   Ht 5' 10.5" (1.791 m)   Wt 257 lb 9.6 oz (116.8 kg)   SpO2 95%   BMI 36.44 kg/m   Physical Exam:  Well appearing NAD HEENT: Unremarkable Neck:  No JVD, no thyromegally Lymphatics:  No adenopathy Back:  No CVA tenderness Lungs:  Clear with no wheezes HEART:  Regular rate rhythm, no murmurs, no rubs, no clicks Abd:  soft, positive bowel sounds, no organomegally, no rebound, no guarding Ext:  2 plus pulses, no edema, no cyanosis, no clubbing Skin:  No rashes no nodules Neuro:  CN II through XII intact, motor grossly intact  Assess/Plan:   PVC's - his symptoms have improved. I have asked him to continue the mexitil. CAD - he denies chest pain. He will continue plavix HTN - his bp has been reasonably well controlled at home though higher in the office.  Tobacco abuse - he is back to smoking a pack a day. He has been encouraged to stop smoking.   Sharlot Gowda Cecil Vandyke,MD

## 2022-07-29 ENCOUNTER — Other Ambulatory Visit: Payer: Self-pay | Admitting: Internal Medicine

## 2022-08-22 ENCOUNTER — Other Ambulatory Visit: Payer: Self-pay | Admitting: Cardiology

## 2022-09-07 ENCOUNTER — Other Ambulatory Visit: Payer: Self-pay | Admitting: Cardiology

## 2022-09-07 ENCOUNTER — Other Ambulatory Visit: Payer: Self-pay

## 2022-09-07 ENCOUNTER — Other Ambulatory Visit: Payer: Self-pay | Admitting: Internal Medicine

## 2022-09-07 MED ORDER — METOPROLOL TARTRATE 25 MG PO TABS: 25.0000 mg | ORAL_TABLET | Freq: Two times a day (BID) | ORAL | 3 refills | Status: DC

## 2022-09-15 ENCOUNTER — Telehealth: Payer: Self-pay | Admitting: Internal Medicine

## 2022-09-15 NOTE — Telephone Encounter (Signed)
Not sure what his copay is, it'd be available for ~$53/1 month supply using a goodrx coupon at [employer] though. If this is unaffordable, would need physician to weigh in on alternative anti-arrhythmic.

## 2022-09-15 NOTE — Telephone Encounter (Signed)
Pt c/o medication issue:  1. Name of Medication: mexiletine (MEXITIL) 150 MG capsule   2. How are you currently taking this medication (dosage and times per day)?   3. Are you having a reaction (difficulty breathing--STAT)?   4. What is your medication issue? Pt states that this medication has gone up in price and would like to know if there are other options for him.

## 2022-09-15 NOTE — Telephone Encounter (Signed)
Patient informed of PharmD: "Not sure what his copay is, it'd be available for ~$53/1 month supply using a goodrx coupon at [employer] though. If this is unaffordable, would need physician to weigh in on alternative anti-arrhythmic." Patient stated that mexiletine works well, but it is too expensive. He is going to check around for pricing, but would like to be on a less expensive medication if possible. Please advise.

## 2022-09-16 NOTE — Telephone Encounter (Signed)
Sotalol would be an option but requires 2 days in the hospital and amiodarone which has potential side effects would be another option. GT

## 2022-09-24 NOTE — Progress Notes (Deleted)
Cardiology Office Note:    Date:  03/30/2022   ID:  Preston Ortiz, DOB January 14, 1956, MRN [id number]  PCP:  Patient, No Pcp Per (Inactive)  [facility] Cardiologist:  Preston Bunting, MD  [facility] Electrophysiologist:  None   Referring MD: No ref. provider found   Chief Complaint  Patient presents with   Coronary Artery Disease     History of Present Illness:    Preston Ortiz is a 66 y.o. male with a hx of CAD, COPD, hyperlipidemia and hypertension.  He presented in November 2021 for chest pain, troponin was greater than 23,000.  EKG showed sinus rhythm with right bundle branch block.  Cardiac catheterization showed 100% proximal and mid RCA occlusion with collaterals, stenosis was treated with drug-eluting stent.  He also had 100% occlusion of small RPL 1 with collaterals, this was managed medically.  Echocardiogram showed EF 50 to 55% with inferobasal hypokinesis, grade 1 DD, no significant valve disease.  Took DAPT for one year. Smoking cessation encouraged. Amlodipine was added during the last visit due to elevated blood pressure.  Lipitor was switched to Crestor due to side effect.  When last seen by Azalee Course PA-C he was noted to have frequent PVCs with a artificially low pulse count. Event monitor was done showing a high PVC burden 39% with multiple morphologies. He was seen by Dr Ladona Ridgel in April who recommended a trial of Mexitil. Dose has increased to 300 mg bid. He notes that since then his palpitations went away.  He does smoke occasionally. He was seen by Dr Ladona Ridgel in July 2022. Repeat event monitor showed a reduction in PVC burden to 5.6%.   On follow up today he denies any cardiac complaints. No chest pain, SOB, palpitations. He is still smoking. Trying to quit. Notes erectile dysfunction. Has chronic neck problems since accident in 2020.    Past Medical History:  Diagnosis Date   Arthritis    osteoathritis   Coronary artery disease    NSTEMI November 2021.  DES of RCA   GERD (gastroesophageal reflux disease)    ocassional   Hyperlipidemia    Hypertension     Past Surgical History:  Procedure Laterality Date   ANTERIOR CERVICAL DECOMP/DISCECTOMY FUSION N/A 05/16/2020   Procedure: ANTERIOR CERVICAL DECOMPRESSION/DISCECTOMY FUSION CERVICAL FIVE THROUGH CERVICAL SEVEN;  Surgeon: Preston Lick, MD;  Location: [facility] OR;  Service: Orthopedics;  Laterality: N/A;  3.5 hrs Needs pre op anesthesia consult-doesn't have a PCP   CORONARY STENT INTERVENTION N/A 09/20/2020   Procedure: CORONARY STENT INTERVENTION;  Surgeon: Preston, Lonnel Ortiz M, MD;  Location: [facility] INVASIVE CV LAB;  Service: Cardiovascular;  Laterality: N/A;   HEMATOMA EVACUATION N/A 05/16/2020   Procedure: EVACUATION HEMATOMA;  Surgeon: Preston Lick, MD;  Location: [facility] OR;  Service: Orthopedics;  Laterality: N/A;   LEFT HEART CATH AND CORONARY ANGIOGRAPHY N/A 09/20/2020   Procedure: LEFT HEART CATH AND CORONARY ANGIOGRAPHY;  Surgeon: Preston, Preston Ortiz M, MD;  Location: [facility] INVASIVE CV LAB;  Service: Cardiovascular;  Laterality: N/A;   TONSILLECTOMY     chilhood   TOTAL HIP ARTHROPLASTY Right 11/06/2016   Procedure: RIGHT TOTAL HIP ARTHROPLASTY ANTERIOR APPROACH;  Surgeon: Preston Hitch, MD;  Location: [facility] ORS;  Service: Orthopedics;  Laterality: Right;    Current Medications: Current Meds  Medication Sig   amLODipine (NORVASC) 5 MG tablet Take 1 tablet by mouth once daily   aspirin EC 81 MG EC tablet Take 1 tablet (81 mg total) by mouth  daily. Swallow whole.   metoprolol tartrate (LOPRESSOR) 25 MG tablet Take 1 tablet (25 mg total) by mouth 2 (two) times daily.   mexiletine (MEXITIL) 150 MG capsule Take 2 capsules by mouth twice daily   rosuvastatin (CRESTOR) 40 MG tablet Take 1 tablet by mouth once daily   [DISCONTINUED] sildenafil (REVATIO) 20 MG tablet Take 1 tablet (20 mg total) by mouth 3 (three) times daily.     Allergies:   Cyclobenzaprine, Gabapentin, Lipitor [atorvastatin], and  Penicillins   Social History   Socioeconomic History   Marital status: Married    Spouse name: Not on file   Number of children: Not on file   Years of education: Not on file   Highest education level: Not on file  Occupational History   Not on file  Tobacco Use   Smoking status: Former    Packs/day: 1.00    Years: 40.00    Pack years: 40.00    Types: Cigarettes    Quit date: 04/2020    Years since quitting: 1.9   Smokeless tobacco: Never  Vaping Use   Vaping Use: Never used  Substance and Sexual Activity   Alcohol use: Yes    Comment: half a pint of liquor Q3 days    Drug use: No   Sexual activity: Yes  Other Topics Concern   Not on file  Social History Narrative   Not on file   Social Determinants of Health   Financial Resource Strain: Not on file  Food Insecurity: Not on file  Transportation Needs: Not on file  Physical Activity: Not on file  Stress: Not on file  Social Connections: Not on file     Family History: The patient's family history includes Cancer in his mother. There is no history of CAD.  ROS:   Please see the history of present illness.     All other systems reviewed and are negative.  EKGs/Labs/Other Studies Reviewed:    The following studies were reviewed today:  Echo 09/20/2020 1. Left ventricular ejection fraction, by estimation, is 50 to 55%. The  left ventricle has low normal function. The left ventricle demonstrates  regional wall motion abnormalities (see scoring diagram/findings for  description). Left ventricular diastolic   parameters are consistent with Grade I diastolic dysfunction (impaired  relaxation). There is moderate hypokinesis of the left ventricular, basal  inferoseptal wall, inferior wall and inferolateral wall.   2. Right ventricular systolic function is normal. The right ventricular  size is normal.   3. The mitral valve is normal in structure. No evidence of mitral valve  regurgitation. No evidence of mitral  stenosis.   4. The aortic valve is normal in structure. Aortic valve regurgitation is  not visualized. No aortic stenosis is present.   5. The inferior vena cava is dilated in size with <50% respiratory  variability, suggesting right atrial pressure of 15 mmHg.    Cath 09/20/2020 Mid LAD to Dist LAD lesion is 30% stenosed. Prox RCA to Mid RCA lesion is 100% stenosed. 1st RPL lesion is 100% stenosed. Post intervention, there is a 0% residual stenosis. A drug-eluting stent was successfully placed using a STENT RESOLUTE ONYX 3.0X26. The left ventricular systolic function is normal. LV end diastolic pressure is normal. The left ventricular ejection fraction is 55-65% by visual estimate.   1. 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 2. Normal LV function 3. Normal LVEDP 4. Successful PCI of the proximal RCA with DES x1.      Plan: DAPT for one year. Anticipate DC tomorrow.   Event monitor 12/25/20: Study Highlights    NSR wth frequent PVCs, occasional PVC coulets and triplets. High PVC burden 39%.   Event monitor 05/13/21: 1. NSR with sinus bradycardia 2. Occaisional PAC's and frequent PVC's. 3. No VT or SVT 4. No atrial fib or flutter   [name],MD   Patch Wear Time:  3 days and 0 hours (2022-07-05T18:07:17-0400 to 2022-07-08T18:07:47-0400)   Patient had a min HR of 43 bpm, max HR of 88 bpm, and avg HR of 58 bpm. Predominant underlying rhythm was Sinus Rhythm. Bundle Branch Block/IVCD was present. Isolated SVEs were rare (<1.0%), SVE Couplets were rare (<1.0%), and no SVE Triplets were  present. Isolated VEs were frequent (5.6%, 13677), VE Couplets were rare (<1.0%, 313), and VE Triplets were rare (<1.0%, 1). Ventricular Bigeminy and Trigeminy were present.  EKG:  EKG is  ordered today. NSR rate 56, Normal Ecg. Since prior tracing RBBB resolved.. I have personally reviewed and interpreted this study.   Recent Labs: No results found for  requested labs within last 8760 hours.  Recent Lipid Panel    Component Value Date/Time   CHOL 92 (L) 11/26/2020 0831   TRIG 128 11/26/2020 0831   HDL 32 (L) 11/26/2020 0831   CHOLHDL 2.9 11/26/2020 0831   CHOLHDL 4.5 09/20/2020 0207   VLDL 24 09/20/2020 0207   LDLCALC 37 11/26/2020 0831     Risk Assessment/Calculations:       Physical Exam:    VS:  BP 130/76   Pulse (!) 56   Ht 5\' 10"  (1.778 m)   Wt 262 lb 9.6 oz (119.1 kg)   SpO2 97%   BMI 37.68 kg/m     Wt Readings from Last 3 Encounters:  03/30/22 262 lb 9.6 oz (119.1 kg)  09/29/21 262 lb 9.6 oz (119.1 kg)  04/29/21 253 lb 9.6 oz (115 kg)     GEN:  Well nourished, well developed in no acute distress HEENT: Normal NECK: No JVD; No carotid bruits LYMPHATICS: No lymphadenopathy CARDIAC: RRR, no murmurs, rubs, gallops RESPIRATORY:  Clear to auscultation without rales, wheezing or rhonchi  ABDOMEN: Soft, non-tender, non-distended MUSCULOSKELETAL:  No edema; No deformity  SKIN: Warm and dry NEUROLOGIC:  Alert and oriented x 3 PSYCHIATRIC:  Normal affect   ASSESSMENT:    1. Coronary artery disease involving native coronary artery of native heart with other form of angina pectoris (HCC)   2. PVC (premature ventricular contraction)   3. Hyperlipidemia with target LDL less than 70   4. Primary hypertension   5. Tobacco use      PLAN:    In order of problems listed above:  CAD s/p STEMI in 2021 with DES of RCA: Denies any chest pain.  Continue ASA, metoprolol, statin.  PVCs: High burden with multiple morphologies. Seen by Dr 2022. Now on Mexitil. Tolerating well. Excellent response with reduction in burden from 39>>5%.   COPD: No acute exacerbation  Hypertension: well controlled. Continue metoprolol/amlodipine.   Hyperlipidemia: On Crestor 40 mg daily. Follow up labs today  6.   Tobacco use recommend complete cessation.   7.   ED refilled sildenifil. Check testosterone level.         Medication  Adjustments/Labs and Tests Ordered: Current medicines are reviewed at length with the patient today.  Concerns regarding medicines are outlined above.  Orders Placed This Encounter  Procedures   EKG 12-Lead    Meds ordered this encounter  Medications   sildenafil (REVATIO) 20 MG tablet    Sig: Take 1 tablet (20 mg total) by mouth daily as needed (erectile dysfunction).    Dispense:  50 tablet    Refill:  3        Signed, Nyeemah Jennette Swaziland, MD  03/30/2022 1:29 PM    [facility]

## 2022-09-28 ENCOUNTER — Ambulatory Visit: Payer: Self-pay | Admitting: Cardiology

## 2022-10-07 ENCOUNTER — Other Ambulatory Visit: Payer: Self-pay | Admitting: Cardiology

## 2022-11-24 ENCOUNTER — Other Ambulatory Visit: Payer: Self-pay | Admitting: Cardiology

## 2022-12-01 ENCOUNTER — Other Ambulatory Visit: Payer: Self-pay | Admitting: Cardiology

## 2022-12-21 ENCOUNTER — Telehealth: Payer: Self-pay | Admitting: Internal Medicine

## 2022-12-21 MED ORDER — MEXILETINE HCL 150 MG PO CAPS: 300.0000 mg | ORAL_CAPSULE | Freq: Two times a day (BID) | ORAL | 2 refills | Status: DC

## 2022-12-21 NOTE — Telephone Encounter (Signed)
*  STAT* If patient is at the pharmacy, call can be transferred to refill team.   1. Which medications need to be refilled? (please list name of each medication and dose if known) new prescription for Mexiletine  2. Which pharmacy/location (including street and city if local pharmacy) is medication to be sent to?[facility] [place]  3. Do they need a 30 day or 90 day supply? 90  days and refills

## 2023-01-09 ENCOUNTER — Other Ambulatory Visit: Payer: Self-pay | Admitting: Cardiology

## 2023-01-20 ENCOUNTER — Ambulatory Visit: Payer: Self-pay | Admitting: Cardiology

## 2023-03-02 ENCOUNTER — Ambulatory Visit: Payer: Medicare Other | Attending: Cardiology | Admitting: Student

## 2023-03-02 ENCOUNTER — Encounter: Payer: Self-pay | Admitting: Student

## 2023-03-02 ENCOUNTER — Other Ambulatory Visit: Payer: Self-pay | Admitting: Cardiology

## 2023-03-02 VITALS — BP 132/74 | HR 60 | Ht 70.5 in | Wt 258.0 lb

## 2023-03-02 DIAGNOSIS — I493 Ventricular premature depolarization: Secondary | ICD-10-CM | POA: Insufficient documentation

## 2023-03-02 DIAGNOSIS — I2510 Atherosclerotic heart disease of native coronary artery without angina pectoris: Secondary | ICD-10-CM | POA: Diagnosis not present

## 2023-03-02 DIAGNOSIS — E785 Hyperlipidemia, unspecified: Secondary | ICD-10-CM | POA: Insufficient documentation

## 2023-03-02 DIAGNOSIS — I10 Essential (primary) hypertension: Secondary | ICD-10-CM | POA: Insufficient documentation

## 2023-03-02 MED ORDER — METOPROLOL SUCCINATE ER 25 MG PO TB24: 25.0000 mg | ORAL_TABLET | Freq: Every day | ORAL | 11 refills | Status: DC

## 2023-03-02 MED ORDER — ROSUVASTATIN CALCIUM 40 MG PO TABS: 40.0000 mg | ORAL_TABLET | Freq: Every day | ORAL | 3 refills | Status: DC

## 2023-03-02 MED ORDER — AMLODIPINE BESYLATE 5 MG PO TABS: 5.0000 mg | ORAL_TABLET | Freq: Every day | ORAL | 3 refills | Status: DC

## 2023-03-02 NOTE — Patient Instructions (Signed)
Medication Instructions:  Your physician has recommended you make the following change in your medication:   Stop Taking Lopressor  Start Taking Toprol XL 25 mg Daily   *If you need a refill on your cardiac medications before your next appointment, please call your pharmacy*   Lab Work: NONE   If you have labs (blood work) drawn today and your tests are completely normal, you will receive your results only by: MyChart Message (if you have MyChart) OR A paper copy in the mail If you have any lab test that is abnormal or we need to change your treatment, we will call you to review the results.   Testing/Procedures: NONE    Follow-Up: At [facility], you and your health needs are our priority.  As part of our continuing mission to provide you with exceptional heart care, we have created designated Provider Care Teams.  These Care Teams include your primary Cardiologist (physician) and Advanced Practice Providers (APPs -  Physician Assistants and Nurse Practitioners) who all work together to provide you with the care you need, when you need it.  We recommend signing up for the patient portal called "MyChart".  Sign up information is provided on this After Visit Summary.  MyChart is used to connect with patients for Virtual Visits (Telemedicine).  Patients are able to view lab/test results, encounter notes, upcoming appointments, etc.  Non-urgent messages can be sent to your provider as well.   To learn more about what you can do with MyChart, go to [email or web address].    Your next appointment:   6 month(s)  Provider:   You may see Peter Swaziland, MD or one of the following Advanced Practice Providers on your designated Care Team:   Randall An, PA-C  Jacolyn Reedy, PA-C     Other Instructions Thank you for choosing [facility]!

## 2023-03-02 NOTE — Progress Notes (Signed)
Cardiology Office Note    Date:  03/02/2023  ID:  RENNE ZOUCHA, DOB 1956-06-10, MRN [id number] Cardiologist: Peter Swaziland, MD   EP: Dr. Ladona Ridgel  History of Present Illness:    Preston Ortiz is a 67 y.o. male with past medical history of CAD (s/p NSTEMI in 08/2020 with DES to proximal-RCA), HTN, HLD and PVC's (prior monitor showing 39% PVC burden and improved to 5.6% by repeat monitor in 04/2021) who presents to the office today for 22-month follow-up.  He was last examined by Dr. Swaziland in 03/2022 and denied any recent cardiac issues at that time. He was continued on his current cardiac medications including ASA 81 mg daily, Amlodipine 5 mg daily, Lopressor 25 mg twice daily, Mexiletine 300 mg twice daily and Crestor 40 mg daily. He did see Dr. Ladona Ridgel in 06/2022 for follow-up of his PVC's and no changes were made to his medications at that time.  In talking with the patient today, he reports things have overall been stable since his last office visit. He denies any recent chest pain or palpitations. Has baseline dyspnea but denies any acute changes in this and feels like it is secondary to his weight and continued tobacco use. No specific orthopnea, PND or pitting edema. He does wish to stop Lopressor if able as he feels like this contributes to his difficulty sleeping at night as this acutely changed when he was started on the medication.  Studies Reviewed:   EKG: EKG is ordered today and demonstrates sinus bradycardia, HR 59 with no acute ST abnormalties  LHC: 08/2020 Mid LAD to Dist LAD lesion is 30% stenosed. Prox RCA to Mid RCA lesion is 100% stenosed. 1st RPL lesion is 100% stenosed. Post intervention, there is a 0% residual stenosis. A drug-eluting stent was successfully placed using a STENT RESOLUTE ONYX 3.0X26. The left ventricular systolic function is normal. LV end diastolic pressure is normal. The left ventricular ejection fraction is 55-65% by visual estimate.   1.  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 2. Normal LV function 3. Normal LVEDP 4. Successful PCI of the proximal RCA with DES x1.    Plan: DAPT for one year. Anticipate DC tomorrow.   Echocardiogram: 08/2020 IMPRESSIONS     1. Left ventricular ejection fraction, by estimation, is 50 to 55%. The  left ventricle has low normal function. The left ventricle demonstrates  regional wall motion abnormalities (see scoring diagram/findings for  description). Left ventricular diastolic   parameters are consistent with Grade I diastolic dysfunction (impaired  relaxation). There is moderate hypokinesis of the left ventricular, basal  inferoseptal wall, inferior wall and inferolateral wall.   2. Right ventricular systolic function is normal. The right ventricular  size is normal.   3. The mitral valve is normal in structure. No evidence of mitral valve  regurgitation. No evidence of mitral stenosis.   4. The aortic valve is normal in structure. Aortic valve regurgitation is  not visualized. No aortic stenosis is present.   5. The inferior vena cava is dilated in size with <50% respiratory  variability, suggesting right atrial pressure of 15 mmHg.    Event Monitor: 04/2021 1. NSR with sinus bradycardia 2. Occaisional PAC's and frequent PVC's. 3. No VT or SVT 4. No atrial fib or flutter   [name],MD   Patch Wear Time:  3 days and 0 hours (2022-07-05T18:07:17-0400 to 2022-07-08T18:07:47-0400)   Patient had a min HR of 43 bpm, max HR of  88 bpm, and avg HR of 58 bpm. Predominant underlying rhythm was Sinus Rhythm. Bundle Branch Block/IVCD was present. Isolated SVEs were rare (<1.0%), SVE Couplets were rare (<1.0%), and no SVE Triplets were  present. Isolated VEs were frequent (5.6%, 13677), VE Couplets were rare (<1.0%, 313), and VE Triplets were rare (<1.0%, 1). Ventricular Bigeminy and Trigeminy were present.   Physical Exam:   VS:  BP 132/74   Pulse  60   Ht 5' 10.5" (1.791 m)   Wt 258 lb (117 kg)   SpO2 96%   BMI 36.50 kg/m    Wt Readings from Last 3 Encounters:  03/02/23 258 lb (117 kg)  06/30/22 257 lb 9.6 oz (116.8 kg)  03/30/22 262 lb 9.6 oz (119.1 kg)     GEN: Well nourished, well developed male appearing in no acute distress NECK: No JVD; No carotid bruits CARDIAC: RRR, no murmurs, rubs, gallops RESPIRATORY:  Clear to auscultation without rales, wheezing or rhonchi  ABDOMEN: Appears non-distended. No obvious abdominal masses. EXTREMITIES: No clubbing or cyanosis. No pitting edema.  Distal pedal pulses are 2+ bilaterally.   Assessment and Plan:   1. CAD - He is s/p NSTEMI in 08/2020 with DES to proximal-RCA. He has baseline dyspnea on exertion but no acute change in symptoms and denies any recent chest pain. Continue ASA 81 mg daily and Crestor 40 mg daily. He does report difficulty with sleep ever since being started on Lopressor 25 mg twice daily, therefore we reviewed we could try switching this to Toprol-XL 25 mg daily to see if symptoms improve. If he does not notice a change in his sleep, we did review he would likely benefit from a sleep study given his daytime somnolence and history of snoring. He wishes to try medication adjustments and focus on weight loss prior to pursuing a sleep study.   2. HTN - BP is at 132/74 during today's visit. Continue current medical therapy with Amlodipine 5 mg daily and will transition Lopressor to Toprol-XL.  3. HLD - FLP in 03/2022 showed total cholesterol 106, triglycerides 132, HDL 37 and LDL 46. Continue Crestor 40mg  daily.   4. PVC's - Prior monitor showed a 39% PVC burden and this had improved to 5.6% by repeat monitor in 04/2021. Followed by Dr. Ladona Ridgel and remains on Mexiletine 300mg  BID. He does wish to change his beta-blocker as discussed above and will transition from Lopressor to Toprol-XL.   Signed, Ellsworth Lennox, PA-C

## 2023-03-03 ENCOUNTER — Ambulatory Visit: Payer: PRIVATE HEALTH INSURANCE | Admitting: Orthopaedic Surgery

## 2023-03-31 ENCOUNTER — Ambulatory Visit (INDEPENDENT_AMBULATORY_CARE_PROVIDER_SITE_OTHER): Payer: Medicare Other | Admitting: Orthopaedic Surgery

## 2023-03-31 ENCOUNTER — Other Ambulatory Visit (INDEPENDENT_AMBULATORY_CARE_PROVIDER_SITE_OTHER): Payer: Medicare Other

## 2023-03-31 VITALS — Ht 70.5 in | Wt 258.0 lb

## 2023-03-31 DIAGNOSIS — M25552 Pain in left hip: Secondary | ICD-10-CM

## 2023-03-31 DIAGNOSIS — M1612 Unilateral primary osteoarthritis, left hip: Secondary | ICD-10-CM | POA: Diagnosis not present

## 2023-03-31 NOTE — Progress Notes (Signed)
The patient is actually well-known to me.  We replaced his right hip in 2018 and that has done incredibly well.  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  He said at this point his left hip pain is daily and it is 10 out of 10.  It is detrimentally affecting his mobility, his quality of life and his actives daily living to the point he is ready to proceed with a left hip replacement.  He is followed by cardiology regularly.  He has had an MI in the past.  He is not on blood thinning medications and he is not a diabetic.  His BMI is 36.5 and he has lost weight.  He is continuing on his weight loss journey.  I was able to review his last notes from cardiology as well as his medications and notes within epic.  He currently denies any chest pain or shortness of breath.  On exam his right operative hip moves smoothly and fluidly with no issues at all.  The left hip has significant limitations with range of motion and severe pain with attempts of range of motion of his left hip.  An AP pelvis and lateral of the left hip shows end-stage arthritis with bone-on-bone wear and complete loss of the joint space that is worsened over the last 6 years.  There are also osteophytes around the hip and sclerotic changes.  The right hip replacement appears well-seated.  He is interested in left hip replacement surgery.  Having had this before he is fully aware of the risk and benefits of the surgery and what to expect from an intraoperative and postoperative standpoint.  All questions and concerns were answered and addressed.  We will work on getting this scheduled.

## 2023-04-07 ENCOUNTER — Encounter (INDEPENDENT_AMBULATORY_CARE_PROVIDER_SITE_OTHER): Payer: Self-pay | Admitting: *Deleted

## 2023-04-09 ENCOUNTER — Telehealth: Payer: Self-pay

## 2023-04-09 NOTE — Telephone Encounter (Signed)
I called patient to discuss scheduling left THA.  Left voice mail for patient to return my call.

## 2023-04-21 ENCOUNTER — Other Ambulatory Visit: Payer: Self-pay | Admitting: Cardiology

## 2023-04-28 ENCOUNTER — Encounter (INDEPENDENT_AMBULATORY_CARE_PROVIDER_SITE_OTHER): Payer: Self-pay | Admitting: Gastroenterology

## 2023-04-28 ENCOUNTER — Ambulatory Visit (INDEPENDENT_AMBULATORY_CARE_PROVIDER_SITE_OTHER): Payer: Medicare Other | Admitting: Gastroenterology

## 2023-04-28 ENCOUNTER — Telehealth (INDEPENDENT_AMBULATORY_CARE_PROVIDER_SITE_OTHER): Payer: Self-pay | Admitting: Gastroenterology

## 2023-04-28 VITALS — BP 145/84 | HR 70 | Temp 98.1°F | Ht 70.5 in | Wt 255.0 lb

## 2023-04-28 DIAGNOSIS — R1310 Dysphagia, unspecified: Secondary | ICD-10-CM

## 2023-04-28 DIAGNOSIS — R195 Other fecal abnormalities: Secondary | ICD-10-CM

## 2023-04-28 DIAGNOSIS — K921 Melena: Secondary | ICD-10-CM

## 2023-04-28 MED ORDER — POLYETHYLENE GLYCOL 3350 17 G PO PACK: 17.0000 g | PACK | Freq: Every day | ORAL | 2 refills | Status: AC

## 2023-04-28 NOTE — Telephone Encounter (Signed)
FYI-pt would like procedure scheduled 1st week of September. I have placed his encounter form in my folder and I will call to schedule him once September schedule is out.

## 2023-04-28 NOTE — Patient Instructions (Signed)
It was very nice to meet you today, as dicussed with will plan for the following :  1) Will schedule upper endoscopy because of occasional difficulty swallowing food 2) Will schedule Colonoscopy given blood in stool and positive cologaurd 3) Miralax daily for constipation

## 2023-04-28 NOTE — Progress Notes (Signed)
Vista Lawman , M.D. Gastroenterology & Hepatology [facility] [address] [place], [place] [address] Primary Care Physician: Lupita Raider, NP [address] [place] [address]  Chief Complaint: Positive Cologuard , painless hematochezia ,  intermittent dysphagia and constipation .    History of Present Illness:  Preston Ortiz is a 67 y.o. male with past medical history of CAD (s/p NSTEMI in 08/2020 on aspirin), HTN, HLD  who presents for evaluation of Positive Cologuard , painless hematochezia ,  intermittent dysphagia and constipation .   Patient reports that he notices fresh blood in stool and sometimes mixed with stool for past couple years.  He reports this started after starting aspirin and thinks he had hemorrhoids.  Never had colonoscopy recently had a Cologuard test positive.  Patient reports cervical spine fusion and have noticed intermittent dysphagia with solid food after that and feels as if food is slowing down the middle of his chest.  Patient also notes occasional hard stools with straining and tries to take prunes and over-the-counter laxative  The patient denies having any nausea, vomiting, fever, chills, melena, hematemesis, abdominal distention, abdominal pain, diarrhea, jaundice, pruritus or weight loss.  Last UJW:JXBJ Last Colonoscopy:None  FHx: neg for any gastrointestinal/liver disease, no malignancies Social: Daily smoking, and daily 1 shot of liquor Surgical: no abdominal surgeries  Past Medical History: Past Medical History:  Diagnosis Date   Arthritis    osteoathritis   Coronary artery disease    NSTEMI November 2021. DES of RCA   GERD (gastroesophageal reflux disease)    ocassional   Hyperlipidemia    Hypertension     Past Surgical History: Past Surgical History:  Procedure Laterality Date   ANTERIOR CERVICAL DECOMP/DISCECTOMY FUSION N/A 05/16/2020   Procedure: ANTERIOR  CERVICAL DECOMPRESSION/DISCECTOMY FUSION CERVICAL FIVE THROUGH CERVICAL SEVEN;  Surgeon: Venita Lick, MD;  Location: [facility] OR;  Service: Orthopedics;  Laterality: N/A;  3.5 hrs Needs pre op anesthesia consult-doesn't have a PCP   CORONARY STENT INTERVENTION N/A 09/20/2020   Procedure: CORONARY STENT INTERVENTION;  Surgeon: Swaziland, Peter M, MD;  Location: [facility] INVASIVE CV LAB;  Service: Cardiovascular;  Laterality: N/A;   HEMATOMA EVACUATION N/A 05/16/2020   Procedure: EVACUATION HEMATOMA;  Surgeon: Venita Lick, MD;  Location: [facility] OR;  Service: Orthopedics;  Laterality: N/A;   LEFT HEART CATH AND CORONARY ANGIOGRAPHY N/A 09/20/2020   Procedure: LEFT HEART CATH AND CORONARY ANGIOGRAPHY;  Surgeon: Swaziland, Peter M, MD;  Location: [facility] INVASIVE CV LAB;  Service: Cardiovascular;  Laterality: N/A;   TONSILLECTOMY     chilhood   TOTAL HIP ARTHROPLASTY Right 11/06/2016   Procedure: RIGHT TOTAL HIP ARTHROPLASTY ANTERIOR APPROACH;  Surgeon: Kathryne Hitch, MD;  Location: [facility] ORS;  Service: Orthopedics;  Laterality: Right;    Family History: Family History  Problem Relation Age of Onset   Cancer Mother    CAD Neg Hx     Social History: Social History   Tobacco Use  Smoking Status Some Days   Packs/day: 1.00   Years: 40.00   Additional pack years: 0.00   Total pack years: 40.00   Types: Cigarettes   Last attempt to quit: 04/2020   Years since quitting: 3.0   Passive exposure: Current  Smokeless Tobacco Never   Social History   Substance and Sexual Activity  Alcohol Use Yes   Comment: half a pint of liquor Q3 days    Social History   Substance and Sexual Activity  Drug Use No    Allergies: Allergies  Allergen Reactions   Cyclobenzaprine     Blurred vision    Gabapentin     Bloody stools    Lipitor [Atorvastatin]     GI issues (diarrhea)    Penicillins     childhood allergy  Has patient had a PCN reaction causing immediate rash, facial/tongue/throat swelling, SOB or  lightheadedness with hypotension: Unknown Has patient had a PCN reaction causing severe rash involving mucus membranes or skin necrosis: Unknown Has patient had a PCN reaction that required hospitalization Unknown Has patient had a PCN reaction occurring within the last 10 years: No If all of the above answers are "NO", then may proceed with Cephalosporin use.     Medications: Current Outpatient Medications  Medication Sig Dispense Refill   amLODipine (NORVASC) 5 MG tablet Take 1 tablet (5 mg total) by mouth daily. 90 tablet 3   aspirin EC 81 MG EC tablet Take 1 tablet (81 mg total) by mouth daily. Swallow whole. 30 tablet 11   metoprolol succinate (TOPROL XL) 25 MG 24 hr tablet Take 1 tablet (25 mg total) by mouth daily. 30 tablet 11   mexiletine (MEXITIL) 150 MG capsule Take 2 capsules (300 mg total) by mouth 2 (two) times daily. 360 capsule 2   polyethylene glycol (MIRALAX / GLYCOLAX) 17 g packet Take 17 g by mouth daily. 30 packet 2   rosuvastatin (CRESTOR) 40 MG tablet Take 1 tablet (40 mg total) by mouth daily. 90 tablet 3   sildenafil (REVATIO) 20 MG tablet TAKE 1 TABLET BY MOUTH ONCE DAILY AS NEEDED FOR ERECTILE DYSFUNCTION 50 tablet 0   No current facility-administered medications for this visit.    Review of Systems: GENERAL: negative for malaise, night sweats HEENT: No changes in hearing or vision, no nose bleeds or other nasal problems. NECK: Negative for lumps, goiter, pain and significant neck swelling RESPIRATORY: Negative for cough, wheezing CARDIOVASCULAR: Negative for chest pain, leg swelling, palpitations, orthopnea GI: SEE HPI MUSCULOSKELETAL: Negative for joint pain or swelling, back pain, and muscle pain. SKIN: Negative for lesions, rash HEMATOLOGY Negative for prolonged bleeding, bruising easily, and swollen nodes. ENDOCRINE: Negative for cold or heat intolerance, polyuria, polydipsia and goiter. NEURO: negative for tremor, gait imbalance, syncope and  seizures. The remainder of the review of systems is noncontributory.   Physical Exam: BP (!) 145/84   Pulse 70   Temp 98.1 F (36.7 C) (Oral)   Ht 5' 10.5" (1.791 m)   Wt 255 lb (115.7 kg)   BMI 36.07 kg/m  GENERAL: The patient is AO x3, in no acute distress. HEENT: Head is normocephalic and atraumatic. EOMI are intact. Mouth is well hydrated and without lesions. NECK: Supple. No masses LUNGS: Clear to auscultation. No presence of rhonchi/wheezing/rales. Adequate chest expansion HEART: RRR, normal s1 and s2. ABDOMEN: Soft, nontender, no guarding, no peritoneal signs, and nondistended. BS +. No masses. EXTREMITIES: Without any cyanosis, clubbing, rash, lesions or edema. NEUROLOGIC: AOx3, no focal motor deficit. SKIN: no jaundice, no rashes   Imaging/Labs: as above  I personally reviewed and interpreted the available labs, imaging and endoscopic files.  HBG: 12.6 MCV: 97   Impression and Plan: Preston Ortiz is a 67 y.o. male with past medical history of CAD (s/p NSTEMI in 08/2020 on aspirin), HTN, HLD  who presents for evaluation of Positive Cologuard , painless hematochezia ,  intermittent dysphagia and constipation .   #Painless hematochezia  This could be due to  hemorrhoids,polyps and malignancy needs to be ruled out  Patient has intermittent painless hematochezia and with last hemoglobin checked in 2021 with normocytic anemia,hbg:12.6 , this is considered a red flag and a diagnostic colonoscopy is indicated. Patient never had colonoscopy  Patient does not have an escort for the procedure and will work on getting his brother to pick him up after the procedure.  Patient was strongly advised to pursue colonoscopy after risks benefit discussion given positive Cologuard and blood in stool.  #Positive cologuard test   Given positive Cologuard next recommendation is to proceed with diagnostic colonoscopy to rule out underlying lesion  # Intermittent solid food dysphagia    Patient reports this after cervical spine surgery but given anemia chronic smoking , age >5 , chronic GERD upper GI lesion such as stricture and malignancy needs to be ruled out.  Will schedule upper endoscopy at the same time  #Constipation  Patient has occasional hard stools with straining  Adequate fluid intake , 8 glass of water daily High fiber diet : Dates, prunes , pears, Kiwi  Miralax daily to BID   All questions were answered.      Vista Lawman, MD Gastroenterology and Hepatology [facility]

## 2023-06-07 ENCOUNTER — Ambulatory Visit (INDEPENDENT_AMBULATORY_CARE_PROVIDER_SITE_OTHER): Payer: Medicare Other | Admitting: Gastroenterology

## 2023-06-07 NOTE — Telephone Encounter (Signed)
Pt left VM asking to be called to schedule procedure

## 2023-06-09 MED ORDER — PEG 3350-KCL-NA BICARB-NACL 420 G PO SOLR: 4000.0000 mL | Freq: Once | ORAL | 0 refills | Status: AC

## 2023-06-09 NOTE — Telephone Encounter (Signed)
Contacted pt and scheduled pt for 07/02/23 at 8:45am. Prep sent to pharmacy. Instructions mailed to pt. Will contact pt with pre op.

## 2023-06-09 NOTE — Addendum Note (Signed)
Addended by: Marlowe Shores on: 06/09/2023 01:51 PM   Modules accepted: Orders

## 2023-06-16 ENCOUNTER — Encounter (INDEPENDENT_AMBULATORY_CARE_PROVIDER_SITE_OTHER): Payer: Self-pay

## 2023-06-21 NOTE — Telephone Encounter (Signed)
Left detailed message with pre op information (94/24@10 :30am)

## 2023-06-30 ENCOUNTER — Encounter (HOSPITAL_COMMUNITY)
Admission: RE | Admit: 2023-06-30 | Discharge: 2023-06-30 | Disposition: A | Discharge status: Home / Self Care | Payer: Medicare Other | Source: Ambulatory Visit | Attending: Gastroenterology | Admitting: Gastroenterology

## 2023-07-02 ENCOUNTER — Encounter (HOSPITAL_COMMUNITY)
Admission: RE | Disposition: A | Discharge status: Home / Self Care | Payer: Self-pay | Source: Home / Self Care | Attending: Gastroenterology

## 2023-07-02 ENCOUNTER — Encounter (HOSPITAL_COMMUNITY): Payer: Self-pay

## 2023-07-02 ENCOUNTER — Ambulatory Visit (HOSPITAL_BASED_OUTPATIENT_CLINIC_OR_DEPARTMENT_OTHER): Payer: Medicare Other | Admitting: Anesthesiology

## 2023-07-02 ENCOUNTER — Ambulatory Visit (HOSPITAL_COMMUNITY)
Admission: RE | Admit: 2023-07-02 | Discharge: 2023-07-02 | Disposition: A | Discharge status: Home / Self Care | Payer: Medicare Other | Attending: Gastroenterology | Admitting: Gastroenterology

## 2023-07-02 ENCOUNTER — Ambulatory Visit (HOSPITAL_COMMUNITY): Payer: Medicare Other | Admitting: Anesthesiology

## 2023-07-02 DIAGNOSIS — F17210 Nicotine dependence, cigarettes, uncomplicated: Secondary | ICD-10-CM | POA: Diagnosis not present

## 2023-07-02 DIAGNOSIS — Z7982 Long term (current) use of aspirin: Secondary | ICD-10-CM | POA: Diagnosis not present

## 2023-07-02 DIAGNOSIS — K644 Residual hemorrhoidal skin tags: Secondary | ICD-10-CM | POA: Insufficient documentation

## 2023-07-02 DIAGNOSIS — I252 Old myocardial infarction: Secondary | ICD-10-CM | POA: Insufficient documentation

## 2023-07-02 DIAGNOSIS — Z981 Arthrodesis status: Secondary | ICD-10-CM | POA: Insufficient documentation

## 2023-07-02 DIAGNOSIS — D126 Benign neoplasm of colon, unspecified: Secondary | ICD-10-CM

## 2023-07-02 DIAGNOSIS — D128 Benign neoplasm of rectum: Secondary | ICD-10-CM | POA: Diagnosis not present

## 2023-07-02 DIAGNOSIS — D649 Anemia, unspecified: Secondary | ICD-10-CM | POA: Insufficient documentation

## 2023-07-02 DIAGNOSIS — K621 Rectal polyp: Secondary | ICD-10-CM

## 2023-07-02 DIAGNOSIS — I10 Essential (primary) hypertension: Secondary | ICD-10-CM

## 2023-07-02 DIAGNOSIS — D123 Benign neoplasm of transverse colon: Secondary | ICD-10-CM | POA: Insufficient documentation

## 2023-07-02 DIAGNOSIS — K31A19 Gastric intestinal metaplasia without dysplasia, unspecified site: Secondary | ICD-10-CM | POA: Diagnosis not present

## 2023-07-02 DIAGNOSIS — K2970 Gastritis, unspecified, without bleeding: Secondary | ICD-10-CM

## 2023-07-02 DIAGNOSIS — K5900 Constipation, unspecified: Secondary | ICD-10-CM | POA: Diagnosis not present

## 2023-07-02 DIAGNOSIS — Z955 Presence of coronary angioplasty implant and graft: Secondary | ICD-10-CM | POA: Insufficient documentation

## 2023-07-02 DIAGNOSIS — K2289 Other specified disease of esophagus: Secondary | ICD-10-CM | POA: Diagnosis not present

## 2023-07-02 DIAGNOSIS — D125 Benign neoplasm of sigmoid colon: Secondary | ICD-10-CM

## 2023-07-02 DIAGNOSIS — K2270 Barrett's esophagus without dysplasia: Secondary | ICD-10-CM

## 2023-07-02 DIAGNOSIS — K921 Melena: Secondary | ICD-10-CM

## 2023-07-02 DIAGNOSIS — K3189 Other diseases of stomach and duodenum: Secondary | ICD-10-CM | POA: Diagnosis not present

## 2023-07-02 DIAGNOSIS — Z1211 Encounter for screening for malignant neoplasm of colon: Secondary | ICD-10-CM

## 2023-07-02 DIAGNOSIS — E785 Hyperlipidemia, unspecified: Secondary | ICD-10-CM | POA: Diagnosis not present

## 2023-07-02 DIAGNOSIS — I2510 Atherosclerotic heart disease of native coronary artery without angina pectoris: Secondary | ICD-10-CM

## 2023-07-02 DIAGNOSIS — R195 Other fecal abnormalities: Secondary | ICD-10-CM | POA: Diagnosis not present

## 2023-07-02 DIAGNOSIS — R1310 Dysphagia, unspecified: Secondary | ICD-10-CM

## 2023-07-02 DIAGNOSIS — K648 Other hemorrhoids: Secondary | ICD-10-CM | POA: Insufficient documentation

## 2023-07-02 DIAGNOSIS — K219 Gastro-esophageal reflux disease without esophagitis: Secondary | ICD-10-CM | POA: Diagnosis not present

## 2023-07-02 DIAGNOSIS — Z79899 Other long term (current) drug therapy: Secondary | ICD-10-CM | POA: Diagnosis not present

## 2023-07-02 HISTORY — PX: SUBMUCOSAL TATTOO INJECTION: SHX6856

## 2023-07-02 HISTORY — PX: ESOPHAGOGASTRODUODENOSCOPY (EGD) WITH PROPOFOL: SHX5813

## 2023-07-02 HISTORY — PX: HEMOSTASIS CLIP PLACEMENT: SHX6857

## 2023-07-02 HISTORY — PX: POLYPECTOMY: SHX5525

## 2023-07-02 HISTORY — PX: COLONOSCOPY WITH PROPOFOL: SHX5780

## 2023-07-02 HISTORY — PX: BIOPSY: SHX5522

## 2023-07-02 LAB — HM COLONOSCOPY

## 2023-07-02 SURGERY — COLONOSCOPY WITH PROPOFOL
Anesthesia: General

## 2023-07-02 MED ORDER — PROPOFOL 500 MG/50ML IV EMUL
INTRAVENOUS | Status: DC | PRN
  Administered 2023-07-02: 110 ug/kg/min via INTRAVENOUS
  Administered 2023-07-02: 100 ug/kg/min via INTRAVENOUS
  Administered 2023-07-02: 125 ug/kg/min via INTRAVENOUS

## 2023-07-02 MED ORDER — LIDOCAINE HCL (CARDIAC) PF 100 MG/5ML IV SOSY
PREFILLED_SYRINGE | INTRAVENOUS | Status: DC | PRN
  Administered 2023-07-02: 50 mg via INTRAVENOUS

## 2023-07-02 MED ORDER — SODIUM CHLORIDE (PF) 0.9 % IJ SOLN
PREFILLED_SYRINGE | INTRAMUSCULAR | Status: DC | PRN
  Administered 2023-07-02: 2 mL

## 2023-07-02 MED ORDER — PROPOFOL 10 MG/ML IV BOLUS
INTRAVENOUS | Status: DC | PRN
  Administered 2023-07-02 (×3): 20 mg via INTRAVENOUS
  Administered 2023-07-02: 150 mg via INTRAVENOUS
  Administered 2023-07-02 (×2): 20 mg via INTRAVENOUS

## 2023-07-02 MED ORDER — LACTATED RINGERS IV SOLN: INTRAVENOUS | Status: DC

## 2023-07-02 MED ORDER — SPOT INK MARKER SYRINGE KIT
PACK | SUBMUCOSAL | Status: DC | PRN
Start: 2023-07-02 — End: 2023-07-02
  Administered 2023-07-02: 2 mL via SUBMUCOSAL

## 2023-07-02 MED ORDER — PHENYLEPHRINE HCL (PRESSORS) 10 MG/ML IV SOLN
INTRAVENOUS | Status: DC | PRN
  Administered 2023-07-02 (×2): 80 ug via INTRAVENOUS
  Administered 2023-07-02: 160 ug via INTRAVENOUS

## 2023-07-02 NOTE — Anesthesia Preprocedure Evaluation (Addendum)
Anesthesia Evaluation  Patient identified by MRN, date of birth, ID band Patient awake    Reviewed: Allergy & Precautions, H&P , NPO status , Patient's Chart, lab work & pertinent test results, reviewed documented beta blocker date and time   Airway Mallampati: II  TM Distance: >3 FB Neck ROM: Limited   Comment: ACDF, severe neck pain Dental  (+) Edentulous Upper, Edentulous Lower   Pulmonary Current Smoker and Patient abstained from smoking.   Pulmonary exam normal breath sounds clear to auscultation       Cardiovascular Exercise Tolerance: Good hypertension, Pt. on medications and Pt. on home beta blockers + CAD, + Past MI and + Cardiac Stents (Stentx1, 2021)  Normal cardiovascular exam Rhythm:Regular Rate:Normal  08/2020 -  Mid LAD to Dist LAD lesion is 30% stenosed.  Prox RCA to Mid RCA lesion is 100% stenosed.  1st RPL lesion is 100% stenosed.  Post intervention, there is a 0% residual stenosis.  A drug-eluting stent was successfully placed using a STENT RESOLUTE ONYX 3.0X26.  The left ventricular systolic function is normal.  LV end diastolic pressure is normal.  The left ventricular ejection fraction is 55-65% by visual estimate.   1. 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 2. Normal LV function 3. Normal LVEDP 4. Successful PCI of the proximal RCA with DES x1.    Plan: DAPT for one year. Anticipate DC tomorrow.     Neuro/Psych negative neurological ROS  negative psych ROS   GI/Hepatic Neg liver ROS,GERD (DYSPHAGIA)  Controlled,,  Endo/Other  negative endocrine ROS    Renal/GU negative Renal ROS  negative genitourinary   Musculoskeletal  (+) Arthritis , Osteoarthritis,    Abdominal   Peds negative pediatric ROS (+)  Hematology negative hematology ROS (+)   Anesthesia Other Findings ACDF   Reproductive/Obstetrics negative OB ROS                              Anesthesia Physical Anesthesia Plan  ASA: 3  Anesthesia Plan: General   Post-op Pain Management: Minimal or no pain anticipated   Induction: Intravenous  PONV Risk Score and Plan: 1 and Propofol infusion  Airway Management Planned: Nasal Cannula and Natural Airway  Additional Equipment:   Intra-op Plan:   Post-operative Plan:   Informed Consent: I have reviewed the patients History and Physical, chart, labs and discussed the procedure including the risks, benefits and alternatives for the proposed anesthesia with the patient or authorized representative who has indicated his/her understanding and acceptance.     Dental advisory given  Plan Discussed with: CRNA and Surgeon  Anesthesia Plan Comments:        Anesthesia Quick Evaluation

## 2023-07-02 NOTE — Op Note (Signed)
[facility] Patient Name: Preston Ortiz Procedure Date: 07/02/2023 8:45 AM MRN: [id number] Date of Birth: 02/20/1956 Attending MD: Sanjuan Dame , MD, [id number] CSN: [id number] Age: 67 Admit Type: Outpatient Procedure:                Colonoscopy Indications:              Positive Cologuard test Providers:                Sanjuan Dame, MD, Edrick Kins, RN, Lennice Sites                            Technician, Technician Referring MD:              Medicines:                Monitored Anesthesia Care Complications:             Estimated Blood Loss:     Estimated blood loss was minimal. Procedure:                Pre-Anesthesia Assessment:                           - Prior to the procedure, a History and Physical                            was performed, and patient medications and                            allergies were reviewed. The patient's tolerance of                            previous anesthesia was also reviewed. The risks                            and benefits of the procedure and the sedation                            options and risks were discussed with the patient.                            All questions were answered, and informed consent                            was obtained. Prior Anticoagulants: The patient has                            taken no anticoagulant or antiplatelet agents                            except for aspirin. ASA Grade Assessment: III - A                            patient with severe systemic disease. After                            reviewing  the risks and benefits, the patient was                            deemed in satisfactory condition to undergo the                            procedure.                           After obtaining informed consent, the colonoscope                            was passed under direct vision. Throughout the                            procedure, the patient's blood pressure, pulse, and                             oxygen saturations were monitored continuously. The                            [id number]) scope was introduced through the                            anus and advanced to the the cecum, identified by                            appendiceal orifice and ileocecal valve. The                            colonoscopy was technically difficult and complex.                            The patient tolerated the procedure well. The                            quality of the bowel preparation was evaluated                            using the BBPS [facility] Bowel Preparation Scale)                            with scores of: Right Colon = 2 (minor amount of                            residual staining, small fragments of stool and/or                            opaque liquid, but mucosa seen well), Transverse                            Colon = 2 (minor amount of residual staining, small  fragments of stool and/or opaque liquid, but mucosa                            seen well) and Left Colon = 2 (minor amount of                            residual staining, small fragments of stool and/or                            opaque liquid, but mucosa seen well). The total                            BBPS score equals 6. The ileocecal valve,                            appendiceal orifice, and rectum were photographed. Scope In: 9:14:42 AM Scope Out: 10:29:22 AM Scope Withdrawal Time: 1 hour 11 minutes 59 seconds  Total Procedure Duration: 1 hour 14 minutes 40 seconds  Findings:      The perianal and digital rectal examinations were normal.      A 7 mm polyp was found in the transverse colon. The polyp was sessile.       The polyp was removed with a cold snare. Resection and retrieval were       complete.      A 50 mm polyp was found in the sigmoid colon. The polyp was       multi-lobulated and pedunculated. Area was successfully injected with 2       mL of a 0.1 mg/mL  solution of epinephrine for hemostasis. The polyp was       removed with a hot snare and The polyp was removed with a piecemeal       technique using a hot snare. Resection and retrieval were complete. Five       hemostatic clips were successfully placed (MR conditional). Area was       tattooed with an injection of 2 mL of Spot (carbon black).      A 5 mm polyp was found in the rectum. The polyp was sessile. The polyp       was removed with a cold snare. Resection and retrieval were complete.      Non-bleeding external and internal hemorrhoids were found during       retroflexion. Impression:               - One 7 mm polyp in the transverse colon, removed                            with a cold snare. Resected and retrieved.                           - One 50 mm polyp in the sigmoid colon, removed                            with a hot snare and removed piecemeal using a hot  snare. Resected and retrieved. Injected. Clips (MR                            conditional) were placed. Tattooed.                           - One 5 mm polyp in the rectum, removed with a cold                            snare. Resected and retrieved.                           - Non-bleeding external and internal hemorrhoids. Moderate Sedation:      Per Anesthesia Care Recommendation:           - Patient has a contact number available for                            emergencies. The signs and symptoms of potential                            delayed complications were discussed with the                            patient. Return to normal activities tomorrow.                            Written discharge instructions were provided to the                            patient.                           - Resume previous diet.                           - Resume aspirin at prior dose in 3 days.                           - Await pathology results.                           - Repeat colonoscopy in 6  months for surveillance.                           - Return to GI clinic as previously scheduled. Procedure Code(s):        --- Professional ---                           [phone], 22, Colonoscopy, flexible; with removal of                            tumor(s), polyp(s), or other lesion(s) by snare  technique                           [id number], Colonoscopy, flexible; with directed                            submucosal injection(s), any substance Diagnosis Code(s):        --- Professional ---                           D12.3, Benign neoplasm of transverse colon (hepatic                            flexure or splenic flexure)                           D12.5, Benign neoplasm of sigmoid colon                           D12.8, Benign neoplasm of rectum                           K64.8, Other hemorrhoids                           R19.5, Other fecal abnormalities CPT copyright 2022 American Medical Association. All rights reserved. The codes documented in this report are preliminary and upon coder review may  be revised to meet current compliance requirements. Sanjuan Dame, MD Sanjuan Dame, MD 07/02/2023 10:41:32 AM This report has been signed electronically. Number of Addenda: 0

## 2023-07-02 NOTE — Anesthesia Postprocedure Evaluation (Signed)
Anesthesia Post Note  Patient: Preston Ortiz  Procedure(s) Performed: COLONOSCOPY WITH PROPOFOL ESOPHAGOGASTRODUODENOSCOPY (EGD) WITH PROPOFOL BIOPSY POLYPECTOMY SUBMUCOSAL TATTOO INJECTION HEMOSTASIS CLIP PLACEMENT  Patient location during evaluation: Phase II Anesthesia Type: General Level of consciousness: awake and alert and oriented Pain management: pain level controlled Vital Signs Assessment: post-procedure vital signs reviewed and stable Respiratory status: spontaneous breathing, nonlabored ventilation and respiratory function stable Cardiovascular status: blood pressure returned to baseline and stable Postop Assessment: no apparent nausea or vomiting Anesthetic complications: no  No notable events documented.   Last Vitals:  Vitals:   07/02/23 0711 07/02/23 1035  BP: (!) 146/88 120/83  Pulse: 72 67  Resp: 15 13  Temp: 36.5 C 36.4 C  SpO2: 98% 96%    Last Pain:  Vitals:   07/02/23 1035  TempSrc: Oral  PainSc: 0-No pain                 Jamoni Hewes C Taysom Glymph

## 2023-07-02 NOTE — H&P (Signed)
Primary Care Physician:  Lupita Raider, NP Primary Gastroenterologist:  Dr. Tasia Catchings  Pre-Procedure History & Physical: HPI: Preston Ortiz is a 67 y.o. male with past medical history of CAD (s/p NSTEMI in 08/2020 on aspirin), HTN, HLD  who is here for EGD/Colonoscopy for  Positive Cologuard , painless hematochezia ,  intermittent dysphagia and constipation .   Colonoscopy for Positive cologuard test    cervical spine surgery but given anemia chronic smoking , age >49 , chronic GERD upper GI lesion such as stricture and malignancy needs to be ruled out.  Proceed with upper endoscopy at the same time  Past Medical History:  Diagnosis Date   Arthritis    osteoathritis   Coronary artery disease    NSTEMI November 2021. DES of RCA   GERD (gastroesophageal reflux disease)    ocassional   Hyperlipidemia    Hypertension     Past Surgical History:  Procedure Laterality Date   ANTERIOR CERVICAL DECOMP/DISCECTOMY FUSION N/A 05/16/2020   Procedure: ANTERIOR CERVICAL DECOMPRESSION/DISCECTOMY FUSION CERVICAL FIVE THROUGH CERVICAL SEVEN;  Surgeon: Venita Lick, MD;  Location: [facility] OR;  Service: Orthopedics;  Laterality: N/A;  3.5 hrs Needs pre op anesthesia consult-doesn't have a PCP   CORONARY STENT INTERVENTION N/A 09/20/2020   Procedure: CORONARY STENT INTERVENTION;  Surgeon: Swaziland, Peter M, MD;  Location: [facility] INVASIVE CV LAB;  Service: Cardiovascular;  Laterality: N/A;   HEMATOMA EVACUATION N/A 05/16/2020   Procedure: EVACUATION HEMATOMA;  Surgeon: Venita Lick, MD;  Location: [facility] OR;  Service: Orthopedics;  Laterality: N/A;   LEFT HEART CATH AND CORONARY ANGIOGRAPHY N/A 09/20/2020   Procedure: LEFT HEART CATH AND CORONARY ANGIOGRAPHY;  Surgeon: Swaziland, Peter M, MD;  Location: [facility] INVASIVE CV LAB;  Service: Cardiovascular;  Laterality: N/A;   TONSILLECTOMY     chilhood   TOTAL HIP ARTHROPLASTY Right 11/06/2016   Procedure: RIGHT TOTAL HIP ARTHROPLASTY ANTERIOR APPROACH;  Surgeon:  Kathryne Hitch, MD;  Location: [facility] ORS;  Service: Orthopedics;  Laterality: Right;    Prior to Admission medications   Medication Sig Start Date End Date Taking? Authorizing Provider  amLODipine (NORVASC) 5 MG tablet Take 1 tablet (5 mg total) by mouth daily. 03/02/23  Yes Strader, Lennart Pall, PA-C  aspirin EC 81 MG EC tablet Take 1 tablet (81 mg total) by mouth daily. Swallow whole. 09/22/20  Yes Duke, Roe Rutherford, PA  metoprolol succinate (TOPROL XL) 25 MG 24 hr tablet Take 1 tablet (25 mg total) by mouth daily. 03/02/23  Yes Strader, [place] M, PA-C  mexiletine (MEXITIL) 150 MG capsule Take 2 capsules (300 mg total) by mouth 2 (two) times daily. 12/21/22  Yes Marinus Maw, MD  rosuvastatin (CRESTOR) 40 MG tablet Take 1 tablet (40 mg total) by mouth daily. 03/02/23  Yes Strader, [place] M, PA-C  sildenafil (REVATIO) 20 MG tablet TAKE 1 TABLET BY MOUTH ONCE DAILY AS NEEDED FOR ERECTILE DYSFUNCTION 04/21/23  Yes Swaziland, Peter M, MD    Allergies as of 06/09/2023 - Review Complete 04/28/2023  Allergen Reaction Noted   Cyclobenzaprine  10/27/2016   Gabapentin  10/27/2016   Lipitor [atorvastatin]  10/07/2020   Penicillins  09/14/2016    Family History  Problem Relation Age of Onset   Cancer Mother    CAD Neg Hx     Social History   Socioeconomic History   Marital status: Single    Spouse name: Not on file   Number of children: Not on file   Years of education: Not  on file   Highest education level: Not on file  Occupational History   Not on file  Tobacco Use   Smoking status: Some Days    Current packs/day: 0.00    Average packs/day: 1 pack/day for 40.0 years (40.0 ttl pk-yrs)    Types: Cigarettes    Start date: 04/1980    Last attempt to quit: 04/2020    Years since quitting: 3.1    Passive exposure: Current   Smokeless tobacco: Never  Vaping Use   Vaping status: Never Used  Substance and Sexual Activity   Alcohol use: Yes    Comment: half a pint of liquor Q3  days    Drug use: No   Sexual activity: Yes  Other Topics Concern   Not on file  Social History Narrative   Not on file   Social Determinants of Health   Financial Resource Strain: Not on file  Food Insecurity: Not on file  Transportation Needs: Not on file  Physical Activity: Not on file  Stress: Not on file  Social Connections: Unknown (09/14/2022)   Received [facility]   Social Network    Social Network: Not on file  Intimate Partner Violence: Unknown (09/14/2022)   Received from [facility], [facility]   HITS    Physically Hurt: Not on file    Insult or Talk Down To: Not on file    Threaten Physical Harm: Not on file    Scream or Curse: Not on file    Review of Systems: See HPI, otherwise negative ROS  Physical Exam: Vital signs in last 24 hours: Temp:  [97.7 F (36.5 C)] 97.7 F (36.5 C) (09/06 0711) Pulse Rate:  [72] 72 (09/06 0711) Resp:  [15] 15 (09/06 0711) BP: (146)/(88) 146/88 (09/06 0711) SpO2:  [98 %] 98 % (09/06 0711) Weight:  [113.9 kg] 113.9 kg (09/06 0711)   General:   Alert,  Well-developed, well-nourished, pleasant and cooperative in NAD Head:  Normocephalic and atraumatic. Eyes:  Sclera clear, no icterus.   Conjunctiva pink. Ears:  Normal auditory acuity. Nose:  No deformity, discharge,  or lesions. Msk:  Symmetrical without gross deformities. Normal posture. Extremities:  Without clubbing or edema. Neurologic:  Alert and  oriented x4;  grossly normal neurologically. Skin:  Intact without significant lesions or rashes. Psych:  Alert and cooperative. Normal mood and affect.  Impression/Plan:  Preston Ortiz is a 67 y.o. male with past medical history of CAD (s/p NSTEMI in 08/2020 on aspirin), HTN, HLD  who is here for EGD/Colonoscopy for  Positive Cologuard , painless hematochezia ,  intermittent dysphagia and constipation .   The risks of the procedure including infection, bleed, or perforation as well as  benefits, limitations, alternatives and imponderables have been reviewed with the patient. Questions have been answered. All parties agreeable.

## 2023-07-02 NOTE — Transfer of Care (Signed)
Immediate Anesthesia Transfer of Care Note  Patient: Preston Ortiz  Procedure(s) Performed: COLONOSCOPY WITH PROPOFOL ESOPHAGOGASTRODUODENOSCOPY (EGD) WITH PROPOFOL BIOPSY POLYPECTOMY SUBMUCOSAL TATTOO INJECTION HEMOSTASIS CLIP PLACEMENT  Patient Location: Short Stay  Anesthesia Type:General  Level of Consciousness: awake and patient cooperative  Airway & Oxygen Therapy: Patient Spontanous Breathing  Post-op Assessment: Report given to RN and Post -op Vital signs reviewed and stable  Post vital signs: Reviewed and stable  Last Vitals:  Vitals Value Taken Time  BP 120/83 07/02/23 1035  Temp 36.4 C 07/02/23 1035  Pulse 67 07/02/23 1035  Resp 13 07/02/23 1035  SpO2 96 % 07/02/23 1035    Last Pain:  Vitals:   07/02/23 1035  TempSrc: Oral  PainSc: 0-No pain         Complications: No notable events documented.

## 2023-07-02 NOTE — Anesthesia Procedure Notes (Signed)
Date/Time: 07/02/2023 8:57 AM  Performed by: Franco Nones, CRNAPre-anesthesia Checklist: Patient identified, Emergency Drugs available, Suction available, Timeout performed and Patient being monitored Patient Re-evaluated:Patient Re-evaluated prior to induction Oxygen Delivery Method: Nasal Cannula

## 2023-07-02 NOTE — Discharge Instructions (Signed)

## 2023-07-02 NOTE — Op Note (Signed)
[facility] Patient Name: Preston Ortiz Procedure Date: 07/02/2023 8:48 AM MRN: [id number] Date of Birth: Oct 06, 1956 Attending MD: Sanjuan Dame , MD, [id number] CSN: [id number] Age: 67 Admit Type: Outpatient Procedure:                Upper GI endoscopy Indications:              Dysphagia Providers:                Sanjuan Dame, MD, Edrick Kins, RN, Lennice Sites                            Technician, Technician Referring MD:              Medicines:                Monitored Anesthesia Care Complications:            No immediate complications. Estimated Blood Loss:     Estimated blood loss: none. Procedure:                Pre-Anesthesia Assessment:                           - Prior to the procedure, a History and Physical                            was performed, and patient medications and                            allergies were reviewed. The patient's tolerance of                            previous anesthesia was also reviewed. The risks                            and benefits of the procedure and the sedation                            options and risks were discussed with the patient.                            All questions were answered, and informed consent                            was obtained. Prior Anticoagulants: The patient has                            taken no anticoagulant or antiplatelet agents                            except for aspirin. ASA Grade Assessment: III - A                            patient with severe systemic disease. After  reviewing the risks and benefits, the patient was                            deemed in satisfactory condition to undergo the                            procedure.                           After obtaining informed consent, the endoscope was                            passed under direct vision. Throughout the                            procedure, the patient's blood pressure, pulse, and                             oxygen saturations were monitored continuously. The                            [id number]-H190 ([id number]) scope was introduced through the                            mouth, and advanced to the second part of duodenum.                            The upper GI endoscopy was accomplished without                            difficulty. The patient tolerated the procedure                            well. Scope In: 9:03:58 AM Scope Out: 9:11:03 AM Total Procedure Duration: 0 hours 7 minutes 5 seconds  Findings:      No endoscopic abnormality was evident in the esophagus to explain the       patient's complaint of dysphagia.      The Z-line was irregular and was found 40 cm from the incisors. Biopsies       were taken with a cold forceps for histology.      Multiple small erosions with no stigmata of recent bleeding were found       in the gastric antrum. Biopsies were taken with a cold forceps for       histology.      The duodenal bulb and second portion of the duodenum were normal. Impression:               - No endoscopic esophageal abnormality to explain                            patient's dysphagia.                           - Z-line irregular, 40 cm from the incisors.  Biopsied.                           - Erosive gastropathy with no stigmata of recent                            bleeding. Biopsied.                           - Normal duodenal bulb and second portion of the                            duodenum. Moderate Sedation:      Per Anesthesia Care Recommendation:           - Patient has a contact number available for                            emergencies. The signs and symptoms of potential                            delayed complications were discussed with the                            patient. Return to normal activities tomorrow.                            Written discharge instructions were provided to the                             patient.                           - Await pathology results.                           - Repeat upper endoscopy for surveillance based on                            pathology results. Procedure Code(s):        --- Professional ---                           [phone], Esophagogastroduodenoscopy, flexible,                            transoral; with biopsy, single or multiple Diagnosis Code(s):        --- Professional ---                           R13.10, Dysphagia, unspecified                           K22.89, Other specified disease of esophagus                           K31.89, Other diseases of stomach and duodenum CPT copyright 2022 American Medical Association. All rights  reserved. The codes documented in this report are preliminary and upon coder review may  be revised to meet current compliance requirements. Sanjuan Dame, MD Sanjuan Dame, MD 07/02/2023 10:31:58 AM This report has been signed electronically. Number of Addenda: 0

## 2023-07-05 ENCOUNTER — Telehealth (INDEPENDENT_AMBULATORY_CARE_PROVIDER_SITE_OTHER): Payer: Self-pay | Admitting: *Deleted

## 2023-07-05 ENCOUNTER — Encounter (INDEPENDENT_AMBULATORY_CARE_PROVIDER_SITE_OTHER): Payer: Self-pay | Admitting: *Deleted

## 2023-07-05 LAB — SURGICAL PATHOLOGY

## 2023-07-05 NOTE — Telephone Encounter (Signed)
Patient left voicemail on Saturday. He had TCS on 9/6. Was in a lot of pain on Saturday when he called. Was asking for something for pain. I called him to see how he was this morning and he said the pain is much better. Having hardly any pain now. He was having pain all the way across waist line on Saturday but Sunday was better and today pain is 99.5% better. He does not feel he needs anything at this time. States Saturday he had a runny stool with some blood and had a 2nd BM later that day that looked like 2 silver pieces of metal in stool. He thinks it might have been staples?   He would like a call about the path results instead of a letter he said. I let him know the results are still pending.   [phone]

## 2023-07-05 NOTE — Telephone Encounter (Signed)
I spoke with patient over the phone thank you   Patient is feeling well, no abdominal pain , complaints of constipation , suggested miralax BID

## 2023-07-06 ENCOUNTER — Other Ambulatory Visit (HOSPITAL_COMMUNITY): Payer: Self-pay | Admitting: Gastroenterology

## 2023-07-06 ENCOUNTER — Ambulatory Visit: Payer: Self-pay | Admitting: Internal Medicine

## 2023-07-06 DIAGNOSIS — K2270 Barrett's esophagus without dysplasia: Secondary | ICD-10-CM

## 2023-07-06 MED ORDER — PANTOPRAZOLE SODIUM 40 MG PO TBEC
40.0000 mg | DELAYED_RELEASE_TABLET | Freq: Every day | ORAL | 2 refills | Status: DC
Start: 2023-07-06 — End: 2023-08-19

## 2023-07-06 NOTE — Progress Notes (Signed)
Preston Ortiz: Can this patient be scheduled for repeat colonoscopy in 6 months directly  Room : 3  Ann: Repeat colonoscopy in 6 months, repeat upper endoscopy 3 years. Also send procedure report and pathology to PCP   I reviewed the pathology results with the patient over the phone in detail . 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 No invasive carcinoma seen   -Suggest PPI daily indefinitely. Smoking cessation and weight loss -Suggest Colonoscopy in 6 months given piecemeal resection of very large polyp  followed by 1 and 3 years .   All questions were answered  Thanks,  Preston Lawman, MD Gastroenterology and Hepatology [facility] Gastroenterology

## 2023-07-09 ENCOUNTER — Encounter (HOSPITAL_COMMUNITY): Payer: Self-pay | Admitting: Gastroenterology

## 2023-08-10 ENCOUNTER — Ambulatory Visit: Payer: Medicare Other | Attending: Internal Medicine | Admitting: Internal Medicine

## 2023-08-10 ENCOUNTER — Encounter: Payer: Self-pay | Admitting: Internal Medicine

## 2023-08-10 VITALS — BP 152/90 | HR 76 | Ht 70.0 in | Wt 249.8 lb

## 2023-08-10 DIAGNOSIS — I493 Ventricular premature depolarization: Secondary | ICD-10-CM | POA: Diagnosis not present

## 2023-08-10 MED ORDER — METOPROLOL SUCCINATE ER 25 MG PO TB24: 25.0000 mg | ORAL_TABLET | Freq: Every day | ORAL | 3 refills | Status: DC

## 2023-08-10 MED ORDER — MEXILETINE HCL 150 MG PO CAPS: 300.0000 mg | ORAL_CAPSULE | Freq: Two times a day (BID) | ORAL | 3 refills | Status: DC

## 2023-08-10 MED ORDER — AMLODIPINE BESYLATE 5 MG PO TABS: 5.0000 mg | ORAL_TABLET | Freq: Every day | ORAL | 3 refills | Status: DC

## 2023-08-10 NOTE — Patient Instructions (Signed)
Medication Instructions:  Your physician recommends that you continue on your current medications as directed. Please refer to the Current Medication list given to you today.  *If you need a refill on your cardiac medications before your next appointment, please call your pharmacy*   Lab Work: NONE   If you have labs (blood work) drawn today and your tests are completely normal, you will receive your results only by: MyChart Message (if you have MyChart) OR A paper copy in the mail If you have any lab test that is abnormal or we need to change your treatment, we will call you to review the results.   Testing/Procedures: NONE    Follow-Up: At [facility], you and your health needs are our priority.  As part of our continuing mission to provide you with exceptional heart care, we have created designated Provider Care Teams.  These Care Teams include your primary Cardiologist (physician) and Advanced Practice Providers (APPs -  Physician Assistants and Nurse Practitioners) who all work together to provide you with the care you need, when you need it.  We recommend signing up for the patient portal called "MyChart".  Sign up information is provided on this After Visit Summary.  MyChart is used to connect with patients for Virtual Visits (Telemedicine).  Patients are able to view lab/test results, encounter notes, upcoming appointments, etc.  Non-urgent messages can be sent to your provider as well.   To learn more about what you can do with MyChart, go to [email or web address].    Your next appointment:   1 year(s)  Provider:   [name], MD    Other Instructions Thank you for choosing [facility]!    

## 2023-08-10 NOTE — Progress Notes (Signed)
HPI Mr. Vickers returns today for followup of his PVC's. He is a pleasant 67 yo with ongoing tobacco abuse despite AMI. He was found to have dense PVC's (36K in 24 hours) and was treated him with mexitil. A repeat heart monitor demonstrated 5% PVC's and he feels well. He denies chest pain or sob. Despite his late presentation his EF is only mildly decreased. He had stopped smoking but now admits to smoking a pack a day.  He has recently been found to have multiple large polyps.  Allergies  Allergen Reactions   Cyclobenzaprine     Blurred vision    Gabapentin     Bloody stools    Lipitor [Atorvastatin]     GI issues (diarrhea)    Penicillins     childhood allergy  Has patient had a PCN reaction causing immediate rash, facial/tongue/throat swelling, SOB or lightheadedness with hypotension: Unknown Has patient had a PCN reaction causing severe rash involving mucus membranes or skin necrosis: Unknown Has patient had a PCN reaction that required hospitalization Unknown Has patient had a PCN reaction occurring within the last 10 years: No If all of the above answers are "NO", then may proceed with Cephalosporin use.      Current Outpatient Medications  Medication Sig Dispense Refill   amLODipine (NORVASC) 5 MG tablet Take 1 tablet (5 mg total) by mouth daily. 90 tablet 3   aspirin EC 81 MG EC tablet Take 1 tablet (81 mg total) by mouth daily. Swallow whole. 30 tablet 11   metoprolol succinate (TOPROL XL) 25 MG 24 hr tablet Take 1 tablet (25 mg total) by mouth daily. 30 tablet 11   mexiletine (MEXITIL) 150 MG capsule Take 2 capsules (300 mg total) by mouth 2 (two) times daily. 360 capsule 2   pantoprazole (PROTONIX) 40 MG tablet Take 1 tablet (40 mg total) by mouth daily. 30 tablet 2   rosuvastatin (CRESTOR) 40 MG tablet Take 1 tablet (40 mg total) by mouth daily. 90 tablet 3   sildenafil (REVATIO) 20 MG tablet TAKE 1 TABLET BY MOUTH ONCE DAILY AS NEEDED FOR ERECTILE DYSFUNCTION 50  tablet 0   testosterone cypionate (DEPOTESTOSTERONE CYPIONATE) 200 MG/ML injection Inject 200 mg into the muscle every 14 (fourteen) days.     No current facility-administered medications for this visit.     Past Medical History:  Diagnosis Date   Arthritis    osteoathritis   Coronary artery disease    NSTEMI November 2021. DES of RCA   GERD (gastroesophageal reflux disease)    ocassional   Hyperlipidemia    Hypertension     ROS:   All systems reviewed and negative except as noted in the HPI.   Past Surgical History:  Procedure Laterality Date   ANTERIOR CERVICAL DECOMP/DISCECTOMY FUSION N/A 05/16/2020   Procedure: ANTERIOR CERVICAL DECOMPRESSION/DISCECTOMY FUSION CERVICAL FIVE THROUGH CERVICAL SEVEN;  Surgeon: Venita Lick, MD;  Location: [facility] OR;  Service: Orthopedics;  Laterality: N/A;  3.5 hrs Needs pre op anesthesia consult-doesn't have a PCP   BIOPSY  07/02/2023   Procedure: BIOPSY;  Surgeon: Franky Macho, MD;  Location: [facility];  Service: Endoscopy;;   COLONOSCOPY WITH PROPOFOL N/A 07/02/2023   Procedure: COLONOSCOPY WITH PROPOFOL;  Surgeon: Franky Macho, MD;  Location: [facility];  Service: Endoscopy;  Laterality: N/A;  8:45AM;ASA 3   CORONARY STENT INTERVENTION N/A 09/20/2020   Procedure: CORONARY STENT INTERVENTION;  Surgeon: Swaziland, Peter M, MD;  Location: [facility]  INVASIVE CV LAB;  Service: Cardiovascular;  Laterality: N/A;   ESOPHAGOGASTRODUODENOSCOPY (EGD) WITH PROPOFOL N/A 07/02/2023   Procedure: ESOPHAGOGASTRODUODENOSCOPY (EGD) WITH PROPOFOL;  Surgeon: Franky Macho, MD;  [facility];  Service: Endoscopy;  Laterality: N/A;  8:45AM;ASA 3   HEMATOMA EVACUATION N/A 05/16/2020   Procedure: EVACUATION HEMATOMA;  Surgeon: Venita Lick, MD;  Location: [facility] OR;  Service: Orthopedics;  Laterality: N/A;   HEMOSTASIS CLIP PLACEMENT  07/02/2023   Procedure: HEMOSTASIS CLIP PLACEMENT;  Surgeon: Franky Macho, MD;  [facility];  Service:  Endoscopy;;   LEFT HEART CATH AND CORONARY ANGIOGRAPHY N/A 09/20/2020   Procedure: LEFT HEART CATH AND CORONARY ANGIOGRAPHY;  Surgeon: Swaziland, Peter M, MD;  Location: [facility] INVASIVE CV LAB;  Service: Cardiovascular;  Laterality: N/A;   POLYPECTOMY  07/02/2023   Procedure: POLYPECTOMY;  Surgeon: Franky Macho, MD;  [facility];  Service: Endoscopy;;  hot and cold snare   SUBMUCOSAL TATTOO INJECTION  07/02/2023   Procedure: SUBMUCOSAL TATTOO INJECTION;  Surgeon: Franky Macho, MD;  [facility];  Service: Endoscopy;;   TONSILLECTOMY     chilhood   TOTAL HIP ARTHROPLASTY Right 11/06/2016   Procedure: RIGHT TOTAL HIP ARTHROPLASTY ANTERIOR APPROACH;  Surgeon: Kathryne Hitch, MD;  Location: [facility]S;  Service: Orthopedics;  Laterality: Right;     Family History  Problem Relation Age of Onset   Cancer Mother    CAD Neg Hx      Social History   Socioeconomic History   Marital status: Single    Spouse name: Not on file   Number of children: Not on file   Years of education: Not on file   Highest education level: Not on file  Occupational History   Not on file  Tobacco Use   Smoking status: Some Days    Current packs/day: 0.00    Average packs/day: 1 pack/day for 40.0 years (40.0 ttl pk-yrs)    Types: Cigarettes    Start date: 04/1980    Last attempt to quit: 04/2020    Years since quitting: 3.2    Passive exposure: Current   Smokeless tobacco: Never  Vaping Use   Vaping status: Never Used  Substance and Sexual Activity   Alcohol use: Yes    Comment: half a pint of liquor Q3 days    Drug use: No   Sexual activity: Yes  Other Topics Concern   Not on file  Social History Narrative   Not on file   Social Determinants of Health   Financial Resource Strain: Not on file  Food Insecurity: Not on file  Transportation Needs: Not on file  Physical Activity: Not on file  Stress: Not on file  Social Connections: Unknown (09/14/2022)   Received [facility]   Social Network    Social Network: Not on file  Intimate Partner Violence: Unknown (09/14/2022)   Received from [employer], [facility]   HITS    Physically Hurt: Not on file    Insult or Talk Down To: Not on file    Threaten Physical Harm: Not on file    Scream or Curse: Not on file     BP (!) 152/98 (BP Location: Left Arm)   Pulse 76   Ht 5\' 10"  (1.778 m)   Wt 249 lb 12.8 oz (113.3 kg)   SpO2 98%   BMI 35.84 kg/m   Physical Exam:  Well appearing NAD HEENT: Unremarkable Neck:  No JVD, no thyromegally Lymphatics:  No adenopathy Back:  No CVA tenderness Lungs:  Clear with no wheezes HEART:  Regular rate rhythm, no murmurs, no rubs, no clicks Abd:  soft, positive bowel sounds, no organomegally, no rebound, no guarding Ext:  2 plus pulses, no edema, no cyanosis, no clubbing Skin:  No rashes no nodules Neuro:  CN II through XII intact, motor grossly intact  DEVICE  Normal device function.  See PaceArt for details.   Assess/Plan:   PVC's - I asked him to continue the mexitil. CAD - he denies chest pain. He will continue plavix HTN - his bp has been reasonably well controlled at home though higher in the office. He was upset about traffic. Tobacco abuse - he is back to smoking a pack a day. He has been encouraged to stop smoking.   Sharlot Gowda Bisma Klett,MD

## 2023-08-12 ENCOUNTER — Telehealth: Payer: Self-pay | Admitting: Cardiology

## 2023-08-12 ENCOUNTER — Other Ambulatory Visit: Payer: Self-pay

## 2023-08-12 MED ORDER — ROSUVASTATIN CALCIUM 40 MG PO TABS: 40.0000 mg | ORAL_TABLET | Freq: Every day | ORAL | 0 refills | Status: DC

## 2023-08-12 MED ORDER — MEXILETINE HCL 150 MG PO CAPS: 300.0000 mg | ORAL_CAPSULE | Freq: Two times a day (BID) | ORAL | 3 refills | Status: DC

## 2023-08-12 NOTE — Telephone Encounter (Signed)
Refill sent.  -Refill for [place] pt sent to correct pharmacy.-

## 2023-08-12 NOTE — Telephone Encounter (Signed)
*  STAT* If patient is at the pharmacy, call can be transferred to refill team.   1. Which medications need to be refilled? (please list name of each medication and dose if known) mexiletine (MEXITIL) 150 MG capsule   2. Which pharmacy/location (including street and city if local pharmacy) is medication to be sent to? [id number] - [place], [place] - [address] AT [address]   3. Do they need a 30 day or 90 day supply? 90

## 2023-08-12 NOTE — Telephone Encounter (Signed)
*  STAT* If patient is at the pharmacy, call can be transferred to refill team.   1. Which medications need to be refilled? (please list name of each medication and dose if known) rosuvastatin (CRESTOR) 40 MG tablet   2. Which pharmacy/location (including street and city if local pharmacy) is medication to be sent to? [id number] - [place], [place] - [address]   3. Do they need a 30 day or 90 day supply? 90

## 2023-08-12 NOTE — Telephone Encounter (Signed)
This medication was prescribed by Doylene Canning. Ladona Ridgel, MD. Will forward to this provider and covering for refill request.

## 2023-08-12 NOTE — Telephone Encounter (Signed)
Patient of Dr.Jordan,refilled as requested

## 2023-08-18 ENCOUNTER — Other Ambulatory Visit: Payer: Self-pay | Admitting: Cardiology

## 2023-08-19 ENCOUNTER — Telehealth: Payer: Self-pay | Admitting: Cardiology

## 2023-08-19 DIAGNOSIS — K2270 Barrett's esophagus without dysplasia: Secondary | ICD-10-CM

## 2023-08-19 MED ORDER — SILDENAFIL CITRATE 20 MG PO TABS: 20.0000 mg | ORAL_TABLET | Freq: Every day | ORAL | 3 refills | Status: DC | PRN

## 2023-08-19 MED ORDER — AMLODIPINE BESYLATE 5 MG PO TABS: 5.0000 mg | ORAL_TABLET | Freq: Every day | ORAL | 3 refills | Status: DC

## 2023-08-19 MED ORDER — ROSUVASTATIN CALCIUM 40 MG PO TABS: 40.0000 mg | ORAL_TABLET | Freq: Every day | ORAL | 0 refills | Status: DC

## 2023-08-19 MED ORDER — PANTOPRAZOLE SODIUM 40 MG PO TBEC: 40.0000 mg | DELAYED_RELEASE_TABLET | Freq: Every day | ORAL | 3 refills | Status: DC

## 2023-08-19 MED ORDER — METOPROLOL SUCCINATE ER 25 MG PO TB24: 25.0000 mg | ORAL_TABLET | Freq: Every day | ORAL | 3 refills | Status: DC

## 2023-08-19 NOTE — Telephone Encounter (Signed)
Pt is requesting a refill on medication sildenafil. Would Dr. Swaziland like to refill this medication? Please address

## 2023-08-19 NOTE — Telephone Encounter (Signed)
Spoke to patient he requested 90 day refills for all medications prescribed by Dr.Jordan.Advised refills sent to [facility] in [place].

## 2023-08-19 NOTE — Telephone Encounter (Signed)
*  STAT* If patient is at the pharmacy, call can be transferred to refill team.   1. Which medications need to be refilled? (please list name of each medication and dose if known) sildenafil (REVATIO) 20 MG tablet  2. Which pharmacy/location (including street and city if local pharmacy) is medication to be sent to? [facility] [address] [place] - [id number] [place] [address] Phone: [phone]  Fax: [phone]      3. Do they need a 30 day or 90 day supply? 90

## 2023-08-19 NOTE — Telephone Encounter (Signed)
Called patient, - needed to ask question - medication was filled 08/12/23. Also patient needs a an appt for the month of Nov 2024

## 2023-08-20 ENCOUNTER — Encounter: Payer: Self-pay | Admitting: Cardiology

## 2023-08-20 NOTE — Telephone Encounter (Signed)
Sent patient a MyChart message   medication  refilled as requested.

## 2023-08-25 ENCOUNTER — Ambulatory Visit: Payer: Medicare Other | Attending: Cardiology | Admitting: Student

## 2023-08-25 ENCOUNTER — Encounter: Payer: Self-pay | Admitting: Student

## 2023-08-25 VITALS — BP 132/82 | HR 76 | Ht 70.5 in | Wt 248.2 lb

## 2023-08-25 DIAGNOSIS — Z01810 Encounter for preprocedural cardiovascular examination: Secondary | ICD-10-CM

## 2023-08-25 DIAGNOSIS — I2510 Atherosclerotic heart disease of native coronary artery without angina pectoris: Secondary | ICD-10-CM | POA: Diagnosis present

## 2023-08-25 DIAGNOSIS — I493 Ventricular premature depolarization: Secondary | ICD-10-CM | POA: Diagnosis present

## 2023-08-25 DIAGNOSIS — E785 Hyperlipidemia, unspecified: Secondary | ICD-10-CM

## 2023-08-25 DIAGNOSIS — I10 Essential (primary) hypertension: Secondary | ICD-10-CM

## 2023-08-25 MED ORDER — AMLODIPINE BESYLATE 5 MG PO TABS: 5.0000 mg | ORAL_TABLET | Freq: Every day | ORAL | 3 refills | Status: DC

## 2023-08-25 MED ORDER — ROSUVASTATIN CALCIUM 40 MG PO TABS: 40.0000 mg | ORAL_TABLET | Freq: Every day | ORAL | 3 refills | Status: DC

## 2023-08-25 NOTE — Progress Notes (Signed)
Cardiology Office Note    Date:  08/25/2023  ID:  Preston Ortiz, DOB Jan 28, 1956, MRN [id number] Cardiologist: Peter Swaziland, MD   EP: Dr. Ladona Ridgel  History of Present Illness:    Preston Ortiz is a 67 y.o. male with past medical history of CAD (s/p NSTEMI in 08/2020 with DES to proximal-RCA), HTN, HLD and PVC's (prior monitor showing 39% PVC burden and improved to 5.6% by repeat monitor in 04/2021) who presents to the office today for 52-month follow-up.  He was examined by myself in 02/2023 and reported baseline dyspnea on exertion but denied any acute changes in this or associated chest pain. He did report difficulty sleeping ever since being on Lopressor 25 mg twice daily and this was transitioned to Toprol-XL 25 mg daily to see if this help with his symptoms. Also discussed a sleep study given daytime somnolence and history of snoring but he wished to focus on medication adjustments and weight loss prior to pursuing a sleep study. He did follow-up with Dr. Ladona Ridgel earlier this month for his PVC's and was continued on Mexiletine 300 mg twice daily.  In talking with the patient today, he reports overall feeling well from a cardiac perspective since his last office visit. He denies any recent chest pain or dyspnea on exertion with routine activities such as performing yard work. Exercise is somewhat limited due to neck pain and hip pain. He is planning undergo hip surgery in the next several months. He denies any specific orthopnea, PND or pitting edema. No recent palpitations.  Studies Reviewed:   EKG: EKG is ordered today and demonstrates:   EKG Interpretation Date/Time:  Wednesday August 25 2023 14:59:03 EDT Ventricular Rate:  76 PR Interval:  180 QRS Duration:  114 QT Interval:  396 QTC Calculation: 445 R Axis:   -24  Text Interpretation: Normal sinus rhythm Normal ECG Confirmed by Randall An ([id number]) on 08/25/2023 3:02:34 PM       Cardiac Catheterization:  08/2020 Mid LAD to Dist LAD lesion is 30% stenosed. Prox RCA to Mid RCA lesion is 100% stenosed. 1st RPL lesion is 100% stenosed. Post intervention, there is a 0% residual stenosis. A drug-eluting stent was successfully placed using a STENT RESOLUTE ONYX 3.0X26. The left ventricular systolic function is normal. LV end diastolic pressure is normal. The left ventricular ejection fraction is 55-65% by visual estimate.   1. 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 2. Normal LV function 3. Normal LVEDP 4. Successful PCI of the proximal RCA with DES x1.    Plan: DAPT for one year. Anticipate DC tomorrow.   Echocardiogram: 08/2020 IMPRESSIONS     1. Left ventricular ejection fraction, by estimation, is 50 to 55%. The  left ventricle has low normal function. The left ventricle demonstrates  regional wall motion abnormalities (see scoring diagram/findings for  description). Left ventricular diastolic   parameters are consistent with Grade I diastolic dysfunction (impaired  relaxation). There is moderate hypokinesis of the left ventricular, basal  inferoseptal wall, inferior wall and inferolateral wall.   2. Right ventricular systolic function is normal. The right ventricular  size is normal.   3. The mitral valve is normal in structure. No evidence of mitral valve  regurgitation. No evidence of mitral stenosis.   4. The aortic valve is normal in structure. Aortic valve regurgitation is  not visualized. No aortic stenosis is present.   5. The inferior vena cava is dilated in size with <50% respiratory  variability, suggesting right atrial pressure of 15 mmHg.   Event Monitor: 04/2021 1. NSR with sinus bradycardia 2. Occaisional PAC's and frequent PVC's. 3. No VT or SVT 4. No atrial fib or flutter     Physical Exam:   VS:  BP 132/82   Pulse 76   Ht 5' 10.5" (1.791 m)   Wt 248 lb 3.2 oz (112.6 kg)   SpO2 98%   BMI 35.11 kg/m    Wt Readings  from Last 3 Encounters:  08/25/23 248 lb 3.2 oz (112.6 kg)  08/10/23 249 lb 12.8 oz (113.3 kg)  07/02/23 251 lb (113.9 kg)     GEN: Well nourished, well developed male appearing in no acute distress NECK: No JVD; No carotid bruits CARDIAC: RRR, no murmurs, rubs, gallops RESPIRATORY:  Clear to auscultation without rales, wheezing or rhonchi  ABDOMEN: Appears non-distended. No obvious abdominal masses. EXTREMITIES: No clubbing or cyanosis. No pitting edema.  Distal pedal pulses are 2+ bilaterally.   Assessment and Plan:   1. CAD/Preoperative cardiac clearance for hip surgery - He did have an NSTEMI in 2021 and received DES placement to the proximal RCA as outlined above. He denies any recent anginal symptoms and EKG today is without acute ST changes. He did mention possibly undergoing hip surgery within the next few months and at this time he would not require further ischemic testing as he is able to perform more than 4 METS of activity without anginal symptoms. RCRI Risk overall low at 0.9% risk of a major cardiac event.  - Continue current medical therapy with ASA 81 mg daily, Toprol-XL 25 mg daily and Crestor 40 mg daily.  2. HTN - Blood pressure was initially recorded at 138/90, rechecked and improved to 132/82. Continue current medical therapy with Amlodipine 5 mg daily and Toprol-XL 25 mg daily.  3. HLD - LDL was at 43 in 02/2023. Continue current medical therapy with Crestor 40 mg daily.  4. PVC's - Followed by Dr. Ladona Ridgel. These have overall been well-controlled by recent EKG tracings and he denies any recent palpitations. Continue current medical therapy with Mexiletine 300 mg twice daily.  Signed, Ellsworth Lennox, PA-C

## 2023-08-25 NOTE — Patient Instructions (Signed)
Medication Instructions:  Your physician recommends that you continue on your current medications as directed. Please refer to the Current Medication list given to you today.  *If you need a refill on your cardiac medications before your next appointment, please call your pharmacy*   Lab Work: None If you have labs (blood work) drawn today and your tests are completely normal, you will receive your results only by: MyChart Message (if you have MyChart) OR A paper copy in the mail If you have any lab test that is abnormal or we need to change your treatment, we will call you to review the results.   Testing/Procedures: None   Follow-Up: At [facility], you and your health needs are our priority.  As part of our continuing mission to provide you with exceptional heart care, we have created designated Provider Care Teams.  These Care Teams include your primary Cardiologist (physician) and Advanced Practice Providers (APPs -  Physician Assistants and Nurse Practitioners) who all work together to provide you with the care you need, when you need it.  We recommend signing up for the patient portal called "MyChart".  Sign up information is provided on this After Visit Summary.  MyChart is used to connect with patients for Virtual Visits (Telemedicine).  Patients are able to view lab/test results, encounter notes, upcoming appointments, etc.  Non-urgent messages can be sent to your provider as well.   To learn more about what you can do with MyChart, go to [email or web address].    Your next appointment:   6 month(s)  Provider:   You may see Peter Swaziland, MD or one of the following Advanced Practice Providers on your designated Care Team:   Randall An, PA-C  Jacolyn Reedy, [place]     Other Instructions

## 2023-09-16 ENCOUNTER — Ambulatory Visit: Payer: Medicare Other | Admitting: Cardiology

## 2023-10-31 ENCOUNTER — Emergency Department (HOSPITAL_COMMUNITY): Payer: Medicare Other

## 2023-10-31 ENCOUNTER — Encounter (HOSPITAL_COMMUNITY): Payer: Self-pay | Admitting: *Deleted

## 2023-10-31 ENCOUNTER — Other Ambulatory Visit: Payer: Self-pay

## 2023-10-31 ENCOUNTER — Emergency Department (HOSPITAL_COMMUNITY)
Admission: EM | Admit: 2023-10-31 | Discharge: 2023-10-31 | Disposition: A | Discharge status: Home / Self Care | Payer: Medicare Other | Attending: Emergency Medicine | Admitting: Emergency Medicine

## 2023-10-31 DIAGNOSIS — Z79899 Other long term (current) drug therapy: Secondary | ICD-10-CM | POA: Insufficient documentation

## 2023-10-31 DIAGNOSIS — I10 Essential (primary) hypertension: Secondary | ICD-10-CM | POA: Diagnosis not present

## 2023-10-31 DIAGNOSIS — J189 Pneumonia, unspecified organism: Secondary | ICD-10-CM

## 2023-10-31 DIAGNOSIS — J181 Lobar pneumonia, unspecified organism: Secondary | ICD-10-CM | POA: Insufficient documentation

## 2023-10-31 DIAGNOSIS — Z7982 Long term (current) use of aspirin: Secondary | ICD-10-CM | POA: Diagnosis not present

## 2023-10-31 DIAGNOSIS — R059 Cough, unspecified: Secondary | ICD-10-CM | POA: Diagnosis present

## 2023-10-31 DIAGNOSIS — I2510 Atherosclerotic heart disease of native coronary artery without angina pectoris: Secondary | ICD-10-CM | POA: Insufficient documentation

## 2023-10-31 MED ORDER — DOXYCYCLINE HYCLATE 100 MG PO TABS
100.0000 mg | ORAL_TABLET | Freq: Once | ORAL | Status: AC
  Administered 2023-10-31: 100 mg via ORAL
  Filled 2023-10-31: qty 1

## 2023-10-31 MED ORDER — DOXYCYCLINE HYCLATE 100 MG PO CAPS: 100.0000 mg | ORAL_CAPSULE | Freq: Two times a day (BID) | ORAL | 0 refills | Status: AC

## 2023-10-31 NOTE — ED Provider Notes (Addendum)
 [name]   CSN: [id number] Arrival date & time: 10/31/23  1206     History  Chief Complaint  Patient presents with   Shortness of Breath    Preston Ortiz is a 68 y.o. male with PMHx CAD, GERD, HLD, HTN who presents to ED concerned for congestion, cough, body aches, subjective fevers, intermittent rhinorrhea and intermittent sore throat, intermittent nausea x2 weeks. Patient stating he spent the holidays with his family and many of them were diagnosed with PNA. Patient wants to make sure that he does not have PNA.  Denies chest pain, vomiting, diarrhea, dysuria, hematuria, hematochezia.    Shortness of Breath      Home Medications Prior to Admission medications   Medication Sig Start Date End Date Taking? Authorizing Provider  amLODipine  (NORVASC ) 5 MG tablet Take 1 tablet (5 mg total) by mouth daily. 08/25/23  Yes Strader, Laymon HERO, PA-C  aspirin  EC 81 MG EC tablet Take 1 tablet (81 mg total) by mouth daily. Swallow whole. Patient taking differently: Take 81 mg by mouth every evening. 09/22/20  Yes Duke, Jon Garre, PA  doxycycline  (VIBRAMYCIN ) 100 MG capsule Take 1 capsule (100 mg total) by mouth 2 (two) times daily for 5 days. 10/31/23 11/05/23 Yes Hoy Fraction F, PA-C  metoprolol  succinate (TOPROL  XL) 25 MG 24 hr tablet Take 1 tablet (25 mg total) by mouth daily. 08/19/23  Yes Jordan, Peter M, MD  mexiletine (MEXITIL) 150 MG capsule Take 2 capsules (300 mg total) by mouth 2 (two) times daily. 08/12/23  Yes Waddell Danelle ORN, MD  Multiple Vitamins-Minerals (MULTIVITAMIN MEN 50+) TABS Take 1 tablet by mouth daily.   Yes [provider]  pantoprazole  (PROTONIX ) 40 MG tablet Take 1 tablet (40 mg total) by mouth daily. Patient taking differently: Take 40 mg by mouth daily as needed (acid reflux). 08/19/23 08/13/24 Yes Jordan, Peter M, MD  rosuvastatin  (CRESTOR ) 40 MG tablet Take 1 tablet (40 mg total)  by mouth daily. Patient taking differently: Take 40 mg by mouth every evening. 08/25/23  Yes Strader, Brittany M, PA-C  sildenafil  (REVATIO ) 20 MG tablet Take 1 tablet (20 mg total) by mouth daily as needed. 08/19/23  Yes Jordan, Peter M, MD  testosterone  cypionate (DEPOTESTOSTERONE CYPIONATE) 200 MG/ML injection Inject 100 mg into the muscle every 14 (fourteen) days. 04/19/23  Yes [provider]  VITAMIN A PO Take 1 capsule by mouth daily.   Yes [provider]      Allergies    Penicillins, Flexeril [cyclobenzaprine], Lipitor  [atorvastatin ], and Neurontin [gabapentin]    Review of Systems   Review of Systems  Respiratory:  Positive for shortness of breath.     Physical Exam Updated Vital Signs BP (!) 150/80 (BP Location: Right Arm)   Pulse 66   Temp 98.1 F (36.7 C) (Oral)   Resp 18   Ht 5' 10.5 (1.791 m)   Wt 108.9 kg   SpO2 98%   BMI 33.95 kg/m  Physical Exam Vitals and nursing note reviewed.  Constitutional:      General: He is not in acute distress.    Appearance: He is not ill-appearing or toxic-appearing.  HENT:     Head: Normocephalic and atraumatic.     Mouth/Throat:     Mouth: Mucous membranes are moist.     Pharynx: No oropharyngeal exudate or posterior oropharyngeal erythema.  Eyes:     General: No scleral icterus.  Right eye: No discharge.        Left eye: No discharge.     Conjunctiva/sclera: Conjunctivae normal.  Cardiovascular:     Rate and Rhythm: Normal rate and regular rhythm.     Pulses: Normal pulses.     Heart sounds: No murmur heard. Pulmonary:     Effort: Pulmonary effort is normal. No tachypnea, accessory muscle usage or respiratory distress.     Breath sounds: Normal breath sounds. No stridor. No wheezing, rhonchi or rales.  Abdominal:     General: Bowel sounds are normal.     Palpations: Abdomen is soft.  Musculoskeletal:     Right lower leg: No edema.     Left lower leg: No edema.  Skin:    General: Skin is  warm and dry.     Findings: No rash.  Neurological:     General: No focal deficit present.     Mental Status: He is alert and oriented to person, place, and time. Mental status is at baseline.  Psychiatric:        Mood and Affect: Mood normal.        Behavior: Behavior normal.     ED Results / Procedures / Treatments   Labs (all labs ordered are listed, but only abnormal results are displayed) Labs Reviewed - No data to display  EKG EKG Interpretation Date/Time:  Sunday October 31 2023 12:47:14 EST Ventricular Rate:  55 PR Interval:  182 QRS Duration:  104 QT Interval:  412 QTC Calculation: 394 R Axis:   130  Text Interpretation: Sinus bradycardia Confirmed by Francesca Fallow ([id number]) on 10/31/2023 4:24:53 PM  Radiology DG Chest Portable 1 View Result Date: 10/31/2023 CLINICAL DATA:  Cough, congestion. EXAM: PORTABLE CHEST 1 VIEW COMPARISON:  Chest radiograph performed the same day. FINDINGS: The heart size and mediastinal contours are within normal limits. There is mild left basilar atelectasis/airspace disease. The right lung is clear. No pleural effusion or pneumothorax. Fixation hardware is seen in the cervical spine. IMPRESSION: Mild left basilar atelectasis/airspace disease. Electronically Signed   By: Norman Hopper M.D.   On: 10/31/2023 15:38   DG Chest 2 View Result Date: 10/31/2023 CLINICAL DATA:  Shortness of breath EXAM: CHEST - 2 VIEW COMPARISON:  09/19/2020 FINDINGS: No consolidation, pneumothorax or effusion. No edema. Normal cardiopericardial silhouette. Chronic lung changes are again seen as on prior. Degenerative changes of the spine. Fixation hardware along the lower cervical spine at the edge of the imaging field. Subtle nodular areas in the right lung base overlying the anterior aspect of the right seventh rib. Recommend a follow up one-view x-ray with nipple markers. IMPRESSION: Subtle nodularity in the right lung base. Would recommend a follow up one-view x-ray  with nipple markers when appropriate to confirm etiology. Electronically Signed   By: Ranell Bring M.D.   On: 10/31/2023 13:29    Procedures Procedures    Medications Ordered in ED Medications  doxycycline  (VIBRA -TABS) tablet 100 mg (100 mg Oral Given 10/31/23 1624)    ED Course/ Medical Decision Making/ A&P                                 Medical Decision Making Amount and/or Complexity of Data Reviewed Radiology: ordered.  Risk Prescription drug management.   This patient presents to the ED for concern of cough this involves an extensive number of treatment options, and is a complaint that carries with  it a high risk of complications and morbidity.  The differential diagnosis includes Anxiety, Anaphylaxis/Angioedema, Aspirated FB, Arrhythmia, CHF, Asthma, COPD, PNA, COVID/Flu/RSV, STEMI, Tamponade, TPNX, Sepsis   Co morbidities that complicate the patient evaluation  CAD, GERD, HLD, HTN   Additional history obtained:  Dr. Hyacinth PCP     Problem List / ED Course / Critical interventions / Medication management  Patient presents to ED concern for rhinorrhea, congestion, fevers, coughing, body aches x2 weeks.  Patient is overall very healthy appearing.  Physical exam unremarkable.  Patient afebrile with stable vitals. The patient was maintained on a cardiac monitor.  I personally viewed and interpreted the cardiac monitored which showed an underlying rhythm of: sinus bradycardia with HR 55. I ordered imaging studies including chest xray to assess for process contributing to patient's symptoms. I independently visualized and interpreted imaging which showed mild left airspace disease. I agree with the radiologist interpretation Shared results with patient.  Provided patient with a dose of doxycycline  in ED which she tolerated well.  Sent the rest the course to pharmacy. Recommended following up with PCP.  Patient verbalized understanding of plan. Staffed with Dr. Francesca I  have reviewed the patients home medicines and have made adjustments as needed Patient afebrile with stable vitals.  Provided with return precautions.  Discharged in good condition.  DDx: These are considered less likely due to history of present illness and physical exam findings -Aspirated FB: no history of choking -Arrhythmia/STEMI: EKG reassuring -Tamponade: physical exam and chest xray reassuring -CHF: no physical exam findings -TPNX: Lungs clear to auscultation bilaterally -Sepsis: afebrile and other vital signs stable   Social Determinants of Health:  none   This note has been dictated using Teaching laboratory technician. Unfortunately, this method of dictation can sometimes lead to typographical or grammatical errors. I apologize for your inconvenience in advance if this occurs. Please do not hesitate to reach out to me if clarification is needed.         Final Clinical Impression(s) / ED Diagnoses Final diagnoses:  Community acquired pneumonia of left lower lobe of lung    Rx / DC Orders ED Discharge Orders          Ordered    doxycycline  (VIBRAMYCIN ) 100 MG capsule  2 times daily        10/31/23 [address], PA-C 10/31/23 1651    Patsey Lot, MD 11/01/23 [phone]

## 2023-10-31 NOTE — Discharge Instructions (Addendum)
 It was a pleasure caring for today.  I have sent your antibiotics to your pharmacy.  Please follow-up with primary care provider.  Seek emergency care experiencing any new or worsening symptoms.

## 2023-10-31 NOTE — ED Triage Notes (Signed)
 Pt concerned for PNA, states several family contacts have PNA.  Pt with cough and congestion. + body aches + fatigue + fevers at home

## 2023-12-10 ENCOUNTER — Encounter (INDEPENDENT_AMBULATORY_CARE_PROVIDER_SITE_OTHER): Payer: Self-pay | Admitting: *Deleted

## 2023-12-29 ENCOUNTER — Telehealth (INDEPENDENT_AMBULATORY_CARE_PROVIDER_SITE_OTHER): Payer: Self-pay | Admitting: Gastroenterology

## 2023-12-29 NOTE — Telephone Encounter (Signed)
 Who is your primary care physician: Lupita Raider  Reasons for the colonoscopy:   Have you had a colonoscopy before?  yes  Do you have family history of colon cancer? no  Previous colonoscopy with polyps removed? yes  Do you have a history colorectal cancer?   no  Are you diabetic? If yes, Type 1 or Type 2?    no  Do you have a prosthetic or mechanical heart valve? no  Do you have a pacemaker/defibrillator?   no  Have you had endocarditis/atrial fibrillation?  Have you had joint replacement within the last 12 months?  no  Do you tend to be constipated or have to use laxatives? yes  Do you have any history of drugs or alchohol?  no  Do you use supplemental oxygen?  no  Have you had a stroke or heart attack within the last 6 months? no  Do you take weight loss medication?  no  Do you take any blood-thinning medications such as: (aspirin, warfarin, Plavix, Aggrenox)    If yes we need the name, milligram, dosage and who is prescribing doctor Aspirin 81 mg daily  Current Outpatient Medications on File Prior to Visit  Medication Sig Dispense Refill   amLODipine (NORVASC) 5 MG tablet Take 1 tablet (5 mg total) by mouth daily. 90 tablet 3   aspirin EC 81 MG EC tablet Take 1 tablet (81 mg total) by mouth daily. Swallow whole. (Patient taking differently: Take 81 mg by mouth every evening.) 30 tablet 11   metoprolol succinate (TOPROL XL) 25 MG 24 hr tablet Take 1 tablet (25 mg total) by mouth daily. 90 tablet 3   mexiletine (MEXITIL) 150 MG capsule Take 2 capsules (300 mg total) by mouth 2 (two) times daily. 360 capsule 3   Multiple Vitamins-Minerals (MULTIVITAMIN MEN 50+) TABS Take 1 tablet by mouth daily.     pantoprazole (PROTONIX) 40 MG tablet Take 1 tablet (40 mg total) by mouth daily. (Patient taking differently: Take 40 mg by mouth daily as needed (acid reflux).) 90 tablet 3   rosuvastatin (CRESTOR) 40 MG tablet Take 1 tablet (40 mg total) by mouth daily. (Patient taking  differently: Take 40 mg by mouth every evening.) 90 tablet 3   sildenafil (REVATIO) 20 MG tablet Take 1 tablet (20 mg total) by mouth daily as needed. (Patient not taking: Reported on 12/29/2023) 50 tablet 3   testosterone cypionate (DEPOTESTOSTERONE CYPIONATE) 200 MG/ML injection Inject 100 mg into the muscle every 14 (fourteen) days. (Patient not taking: Reported on 12/29/2023)     VITAMIN A PO Take 1 capsule by mouth daily. (Patient not taking: Reported on 12/29/2023)     No current facility-administered medications on file prior to visit.    Allergies  Allergen Reactions   Penicillins Anaphylaxis   Flexeril [Cyclobenzaprine] Other (See Comments)    Blurred vision    Lipitor [Atorvastatin] Diarrhea and Other (See Comments)    GI intolerence   Neurontin [Gabapentin] Other (See Comments)    Bloody stools      Pharmacy:   Primary Insurance Name:   Best number where you can be reached: [phone]

## 2023-12-29 NOTE — Telephone Encounter (Signed)
Ok to schedule.  Room : any   Thanks,  Vista Lawman, MD Gastroenterology and Hepatology [facility]

## 2024-01-05 MED ORDER — PEG 3350-KCL-NA BICARB-NACL 420 G PO SOLR
4000.0000 mL | Freq: Once | ORAL | 0 refills | Status: DC
Start: 2024-01-05 — End: 2024-01-12

## 2024-01-05 NOTE — Addendum Note (Signed)
 Addended by: Marlowe Shores on: 01/05/2024 02:38 PM   Modules accepted: Orders

## 2024-01-05 NOTE — Telephone Encounter (Signed)
 Pt called into office checking on TCS questionnaire. Scheduled pt for 01/13/24. Prep sent to pharmacy. Instructions mailed.

## 2024-01-06 NOTE — Telephone Encounter (Signed)
 Questionnaire from recall, no referral needed

## 2024-01-10 ENCOUNTER — Encounter (HOSPITAL_COMMUNITY)
Admission: RE | Admit: 2024-01-10 | Discharge: 2024-01-10 | Disposition: A | Discharge status: Home / Self Care | Source: Ambulatory Visit | Attending: Gastroenterology | Admitting: Gastroenterology

## 2024-01-10 ENCOUNTER — Encounter (HOSPITAL_COMMUNITY): Payer: Self-pay

## 2024-01-10 HISTORY — DX: Acute myocardial infarction, unspecified: I21.9

## 2024-01-10 HISTORY — DX: Cardiac arrhythmia, unspecified: I49.9

## 2024-01-12 ENCOUNTER — Other Ambulatory Visit (INDEPENDENT_AMBULATORY_CARE_PROVIDER_SITE_OTHER): Payer: Self-pay

## 2024-01-12 MED ORDER — PEG 3350-KCL-NA BICARB-NACL 420 G PO SOLR: 4000.0000 mL | Freq: Once | ORAL | 0 refills | Status: AC

## 2024-01-13 ENCOUNTER — Ambulatory Visit (HOSPITAL_COMMUNITY): Admitting: Anesthesiology

## 2024-01-13 ENCOUNTER — Encounter (HOSPITAL_COMMUNITY)
Admission: RE | Disposition: A | Discharge status: Home / Self Care | Payer: Self-pay | Source: Home / Self Care | Attending: Gastroenterology

## 2024-01-13 ENCOUNTER — Encounter (HOSPITAL_COMMUNITY): Payer: Self-pay | Admitting: Gastroenterology

## 2024-01-13 ENCOUNTER — Ambulatory Visit (HOSPITAL_COMMUNITY)
Admission: RE | Admit: 2024-01-13 | Discharge: 2024-01-13 | Disposition: A | Discharge status: Home / Self Care | Attending: Gastroenterology | Admitting: Gastroenterology

## 2024-01-13 DIAGNOSIS — I2510 Atherosclerotic heart disease of native coronary artery without angina pectoris: Secondary | ICD-10-CM | POA: Insufficient documentation

## 2024-01-13 DIAGNOSIS — D125 Benign neoplasm of sigmoid colon: Secondary | ICD-10-CM | POA: Diagnosis not present

## 2024-01-13 DIAGNOSIS — I252 Old myocardial infarction: Secondary | ICD-10-CM | POA: Diagnosis not present

## 2024-01-13 DIAGNOSIS — K219 Gastro-esophageal reflux disease without esophagitis: Secondary | ICD-10-CM | POA: Diagnosis not present

## 2024-01-13 DIAGNOSIS — F17210 Nicotine dependence, cigarettes, uncomplicated: Secondary | ICD-10-CM | POA: Insufficient documentation

## 2024-01-13 DIAGNOSIS — Z1211 Encounter for screening for malignant neoplasm of colon: Secondary | ICD-10-CM | POA: Diagnosis present

## 2024-01-13 DIAGNOSIS — K6389 Other specified diseases of intestine: Secondary | ICD-10-CM | POA: Diagnosis not present

## 2024-01-13 DIAGNOSIS — K5730 Diverticulosis of large intestine without perforation or abscess without bleeding: Secondary | ICD-10-CM | POA: Diagnosis not present

## 2024-01-13 DIAGNOSIS — K635 Polyp of colon: Secondary | ICD-10-CM | POA: Diagnosis not present

## 2024-01-13 DIAGNOSIS — M5020 Other cervical disc displacement, unspecified cervical region: Secondary | ICD-10-CM | POA: Insufficient documentation

## 2024-01-13 DIAGNOSIS — I10 Essential (primary) hypertension: Secondary | ICD-10-CM | POA: Diagnosis not present

## 2024-01-13 DIAGNOSIS — K648 Other hemorrhoids: Secondary | ICD-10-CM | POA: Diagnosis not present

## 2024-01-13 DIAGNOSIS — Z955 Presence of coronary angioplasty implant and graft: Secondary | ICD-10-CM | POA: Diagnosis not present

## 2024-01-13 DIAGNOSIS — R1310 Dysphagia, unspecified: Secondary | ICD-10-CM | POA: Diagnosis not present

## 2024-01-13 DIAGNOSIS — M1990 Unspecified osteoarthritis, unspecified site: Secondary | ICD-10-CM | POA: Insufficient documentation

## 2024-01-13 DIAGNOSIS — K2270 Barrett's esophagus without dysplasia: Secondary | ICD-10-CM

## 2024-01-13 DIAGNOSIS — Z860101 Personal history of adenomatous and serrated colon polyps: Secondary | ICD-10-CM

## 2024-01-13 HISTORY — PX: COLONOSCOPY: SHX5424

## 2024-01-13 HISTORY — PX: POLYPECTOMY: SHX5525

## 2024-01-13 SURGERY — COLONOSCOPY
Anesthesia: General

## 2024-01-13 MED ORDER — PANTOPRAZOLE SODIUM 40 MG PO TBEC: 40.0000 mg | DELAYED_RELEASE_TABLET | Freq: Every day | ORAL | 4 refills | Status: AC

## 2024-01-13 MED ORDER — LACTATED RINGERS IV SOLN: INTRAVENOUS | Status: DC | PRN

## 2024-01-13 MED ORDER — SODIUM CHLORIDE 0.9% FLUSH: 3.0000 mL | Freq: Two times a day (BID) | INTRAVENOUS | Status: DC

## 2024-01-13 MED ORDER — SODIUM CHLORIDE 0.9% FLUSH: 3.0000 mL | INTRAVENOUS | Status: DC | PRN

## 2024-01-13 MED ORDER — LIDOCAINE HCL (CARDIAC) PF 100 MG/5ML IV SOSY
PREFILLED_SYRINGE | INTRAVENOUS | Status: DC | PRN
  Administered 2024-01-13: 50 mg via INTRATRACHEAL

## 2024-01-13 MED ORDER — PROPOFOL 10 MG/ML IV BOLUS
INTRAVENOUS | Status: DC | PRN
  Administered 2024-01-13: 40 mg via INTRAVENOUS
  Administered 2024-01-13: 75 mg via INTRAVENOUS

## 2024-01-13 MED ORDER — PROPOFOL 500 MG/50ML IV EMUL
INTRAVENOUS | Status: DC | PRN
  Administered 2024-01-13: 150 ug/kg/min via INTRAVENOUS

## 2024-01-13 NOTE — Anesthesia Preprocedure Evaluation (Addendum)
 Anesthesia Evaluation  Patient identified by MRN, date of birth, ID band Patient awake    Reviewed: Allergy & Precautions, H&P , NPO status , Patient's Chart, lab work & pertinent test results, reviewed documented beta blocker date and time   Airway Mallampati: II  TM Distance: >3 FB Neck ROM: Limited   Comment: ACDF, severe neck pain Dental  (+) Edentulous Upper, Edentulous Lower   Pulmonary Current Smoker and Patient abstained from smoking.   Pulmonary exam normal breath sounds clear to auscultation       Cardiovascular Exercise Tolerance: Good hypertension, Pt. on medications and Pt. on home beta blockers + CAD, + Past MI and + Cardiac Stents (Stentx1, 2021)  Normal cardiovascular exam+ dysrhythmias  Rhythm:Regular Rate:Normal  08/2020 -  Mid LAD to Dist LAD lesion is 30% stenosed.  Prox RCA to Mid RCA lesion is 100% stenosed.  1st RPL lesion is 100% stenosed.  Post intervention, there is a 0% residual stenosis.  A drug-eluting stent was successfully placed using a STENT RESOLUTE ONYX 3.0X26.  The left ventricular systolic function is normal.  LV end diastolic pressure is normal.  The left ventricular ejection fraction is 55-65% by visual estimate.   1. 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 2. Normal LV function 3. Normal LVEDP 4. Successful PCI of the proximal RCA with DES x1.    Plan: DAPT for one year. Anticipate DC tomorrow.     Neuro/Psych Cervical disc herniation  negative psych ROS   GI/Hepatic Neg liver ROS,GERD (DYSPHAGIA)  Controlled,,  Endo/Other  negative endocrine ROS    Renal/GU negative Renal ROS  negative genitourinary   Musculoskeletal  (+) Arthritis , Osteoarthritis,    Abdominal   Peds negative pediatric ROS (+)  Hematology negative hematology ROS (+)   Anesthesia Other Findings ACDF   Reproductive/Obstetrics negative OB ROS                              Anesthesia Physical Anesthesia Plan  ASA: 3  Anesthesia Plan: General   Post-op Pain Management: Minimal or no pain anticipated   Induction: Intravenous  PONV Risk Score and Plan: 1 and Propofol infusion  Airway Management Planned: Nasal Cannula and Natural Airway  Additional Equipment:   Intra-op Plan:   Post-operative Plan:   Informed Consent: I have reviewed the patients History and Physical, chart, labs and discussed the procedure including the risks, benefits and alternatives for the proposed anesthesia with the patient or authorized representative who has indicated his/her understanding and acceptance.     Dental advisory given  Plan Discussed with: CRNA  Anesthesia Plan Comments:        Anesthesia Quick Evaluation

## 2024-01-13 NOTE — Discharge Instructions (Signed)

## 2024-01-13 NOTE — Anesthesia Postprocedure Evaluation (Signed)
 Anesthesia Post Note  Patient: Preston Ortiz  Procedure(s) Performed: COLONOSCOPY POLYPECTOMY  Patient location during evaluation: Short Stay Anesthesia Type: General Level of consciousness: awake and alert Pain management: pain level controlled Vital Signs Assessment: post-procedure vital signs reviewed and stable Respiratory status: spontaneous breathing Cardiovascular status: blood pressure returned to baseline and stable Postop Assessment: no apparent nausea or vomiting Anesthetic complications: no   No notable events documented.   Last Vitals:  Vitals:   01/13/24 0928  BP: 135/84  Pulse: 70  Resp: 18  Temp: 36.7 C  SpO2: 99%    Last Pain:  Vitals:   01/13/24 1031  TempSrc:   PainSc: 0-No pain                 Kiante Ciavarella

## 2024-01-13 NOTE — Op Note (Signed)
 [facility] Patient Name: Preston Ortiz Procedure Date: 01/13/2024 10:16 AM MRN: [id number] Date of Birth: [birth year] Attending MD: Sanjuan Dame , MD, [id number] CSN: [id number] Age: 68 Admit Type: Outpatient Procedure:                Colonoscopy Indications:              Surveillance: Piecemeal removal of large sessile                            adenoma last colonoscopy (< 3 yrs), High risk colon                            cancer surveillance: Personal history of adenoma                            (10 mm or greater in size), High risk colon cancer                            surveillance: Personal history of adenoma with high                            grade dysplasia Providers:                Sanjuan Dame, MD, Francoise Ceo RN, RN, Pandora Leiter, Technician Referring MD:              Medicines:                Monitored Anesthesia Care Complications:            No immediate complications. Estimated Blood Loss:     Estimated blood loss: none. Procedure:                Pre-Anesthesia Assessment:                           - Prior to the procedure, a History and Physical                            was performed, and patient medications and                            allergies were reviewed. The patient's tolerance of                            previous anesthesia was also reviewed. The risks                            and benefits of the procedure and the sedation                            options and risks were discussed with the patient.  All questions were answered, and informed consent                            was obtained. Prior Anticoagulants: The patient has                            taken no anticoagulant or antiplatelet agents. ASA                            Grade Assessment: II - A patient with mild systemic                            disease. After reviewing the risks and benefits,                             the patient was deemed in satisfactory condition to                            undergo the procedure.                           After obtaining informed consent, the colonoscope                            was passed under direct vision. Throughout the                            procedure, the patient's blood pressure, pulse, and                            oxygen saturations were monitored continuously. The                            [id number]-[id number] ([id number]) scope was introduced through                            the anus and advanced to the the cecum, identified                            by appendiceal orifice and ileocecal valve. The                            colonoscopy was performed without difficulty. The                            patient tolerated the procedure well. The quality                            of the bowel preparation was evaluated using the                            BBPS [facility] Bowel Preparation Scale) with scores  of: Right Colon = 3, Transverse Colon = 3 and Left                            Colon = 3 (entire mucosa seen well with no residual                            staining, small fragments of stool or opaque                            liquid). The total BBPS score equals 9. The                            ileocecal valve, appendiceal orifice, and rectum                            were photographed. Scope In: 10:40:43 AM Scope Out: 11:04:52 AM Scope Withdrawal Time: 0 hours 21 minutes 44 seconds  Total Procedure Duration: 0 hours 24 minutes 9 seconds  Findings:      The perianal and digital rectal examinations were normal.      A 5 mm polyp was found in the sigmoid colon. The polyp was sessile. The       polyp was removed with a hot snare. Resection and retrieval were       complete.      An area of granular mucosa was found in the sigmoid colon. Biopsies were       taken with a cold forceps for histology.      Scattered diverticula were  found in the left colon.      Non-bleeding internal hemorrhoids were found during retroflexion. The       hemorrhoids were small. Impression:               - One 5 mm polyp in the sigmoid colon, removed with                            a hot snare. Resected and retrieved.                           - Granularity in the sigmoid colon around the                            tatooed area. Biopsied.                           - Diverticulosis in the left colon.                           - Non-bleeding internal hemorrhoids. Moderate Sedation:      Per Anesthesia Care Recommendation:           - Patient has a contact number available for                            emergencies. The signs and symptoms of potential  delayed complications were discussed with the                            patient. Return to normal activities tomorrow.                            Written discharge instructions were provided to the                            patient.                           - High fiber diet.                           - Continue present medications.                           - Await pathology results.                           - Repeat colonoscopy in 1 year for surveillance as                            per ACG guideline for piecemeal large polypectomy(1                            year, 3 years than q 5 years)                           - Return to primary care physician as previously                            scheduled. Procedure Code(s):        --- Professional ---                           [phone], Colonoscopy, flexible; with removal of                            tumor(s), polyp(s), or other lesion(s) by snare                            technique                           45380, 59, Colonoscopy, flexible; with biopsy,                            single or multiple Diagnosis Code(s):        --- Professional ---                           D12.5, Benign neoplasm of sigmoid  colon                           K63.89, Other specified diseases of  intestine                           K64.8, Other hemorrhoids                           Z86.010, Personal history of colonic polyps                           K57.30, Diverticulosis of large intestine without                            perforation or abscess without bleeding CPT copyright 2022 American Medical Association. All rights reserved. The codes documented in this report are preliminary and upon coder review may  be revised to meet current compliance requirements. Sanjuan Dame, MD Sanjuan Dame, MD 01/13/2024 11:21:04 AM This report has been signed electronically. Number of Addenda: 0

## 2024-01-13 NOTE — Transfer of Care (Signed)
 Immediate Anesthesia Transfer of Care Note  Patient: Preston Ortiz  Procedure(s) Performed: COLONOSCOPY POLYPECTOMY  Patient Location: Short Stay  Anesthesia Type:General  Level of Consciousness: awake  Airway & Oxygen Therapy: Patient Spontanous Breathing  Post-op Assessment: Report given to RN  Post vital signs: Reviewed and stable  Last Vitals:  Vitals Value Taken Time  BP 119/77 01/13/24 1113  Temp    Pulse 72 01/13/24 1115  Resp 19 01/13/24 1115  SpO2 98 % 01/13/24 1115  Vitals shown include unfiled device data.  Last Pain:  Vitals:   01/13/24 1031  TempSrc:   PainSc: 0-No pain         Complications: No notable events documented.

## 2024-01-13 NOTE — H&P (Signed)
 Primary Care Physician:  Lupita Raider, NP Primary Gastroenterologist:  Dr. Tasia Catchings  Pre-Procedure History & Physical: HPI:  Preston Ortiz is a 68 y.o. male is here for a surveillance colonoscopy for large polypectomy ( Tubular adenoma with high grade dysplasia )  .  Patient denies any family history of colorectal cancer.  No melena or hematochezia.  No abdominal pain or unintentional weight loss.  No change in bowel habits.  Overall feels well from a GI standpoint.  Last EGD and Colonoscopy 06/2023  1)Barrett's esophagus   2) 5cm Tubular adenoma with High grade dysplasia . No invasive carcinoma seen     Past Medical History:  Diagnosis Date   Arthritis    osteoathritis   Coronary artery disease    NSTEMI November 2021. DES of RCA   Dysrhythmia    GERD (gastroesophageal reflux disease)    ocassional   Hyperlipidemia    Hypertension    Myocardial infarction [facility])     Past Surgical History:  Procedure Laterality Date   ANTERIOR CERVICAL DECOMP/DISCECTOMY FUSION N/A 05/16/2020   Procedure: ANTERIOR CERVICAL DECOMPRESSION/DISCECTOMY FUSION CERVICAL FIVE THROUGH CERVICAL SEVEN;  Surgeon: Venita Lick, MD;  Location: [facility] OR;  Service: Orthopedics;  Laterality: N/A;  3.5 hrs Needs pre op anesthesia consult-doesn't have a PCP   BIOPSY  07/02/2023   Procedure: BIOPSY;  Surgeon: Franky Macho, MD;  Location: [facility] ENDO SUITE;  Service: Endoscopy;;   COLONOSCOPY WITH PROPOFOL N/A 07/02/2023   Procedure: COLONOSCOPY WITH PROPOFOL;  Surgeon: Franky Macho, MD;  Location: [facility];  Service: Endoscopy;  Laterality: N/A;  8:45AM;ASA 3   CORONARY STENT INTERVENTION N/A 09/20/2020   Procedure: CORONARY STENT INTERVENTION;  Surgeon: Swaziland, Peter M, MD;  Location: [facility] INVASIVE CV LAB;  Service: Cardiovascular;  Laterality: N/A;   ESOPHAGOGASTRODUODENOSCOPY (EGD) WITH PROPOFOL N/A 07/02/2023   Procedure: ESOPHAGOGASTRODUODENOSCOPY (EGD) WITH PROPOFOL;  Surgeon: Franky Macho, MD;   Location: [facility];  Service: Endoscopy;  Laterality: N/A;  8:45AM;ASA 3   HEMATOMA EVACUATION N/A 05/16/2020   Procedure: EVACUATION HEMATOMA;  Surgeon: Venita Lick, MD;  Location: [facility] OR;  Service: Orthopedics;  Laterality: N/A;   HEMOSTASIS CLIP PLACEMENT  07/02/2023   Procedure: HEMOSTASIS CLIP PLACEMENT;  Surgeon: Franky Macho, MD;  Location: [facility];  Service: Endoscopy;;   LEFT HEART CATH AND CORONARY ANGIOGRAPHY N/A 09/20/2020   Procedure: LEFT HEART CATH AND CORONARY ANGIOGRAPHY;  Surgeon: Swaziland, Peter M, MD;  Location[facility] INVASIVE CV LAB;  Service: Cardiovascular;  Laterality: N/A;   POLYPECTOMY  07/02/2023   Procedure: POLYPECTOMY;  Surgeon: Franky Macho, MD;  [facility]: [facility];  Service: Endoscopy;;  hot and cold snare   SUBMUCOSAL TATTOO INJECTION  07/02/2023   Procedure: SUBMUCOSAL TATTOO INJECTION;  Surgeon: Franky Macho, MD;  Location: [facility];  Service: Endoscopy;;   TONSILLECTOMY     chilhood   TOTAL HIP ARTHROPLASTY Right 11/06/2016   Procedure: RIGHT TOTAL HIP ARTHROPLASTY ANTERIOR APPROACH;  Surgeon: Kathryne Hitch, MD;  Location: [facility] ORS;  Service: Orthopedics;  Laterality: Right;    Prior to Admission medications   Medication Sig Start Date End Date Taking? Authorizing Provider  amLODipine (NORVASC) 5 MG tablet Take 1 tablet (5 mg total) by mouth daily. 08/25/23  Yes Strader, Lennart Pall, PA-C  aspirin EC 81 MG EC tablet Take 1 tablet (81 mg total) by mouth daily. Swallow whole. Patient taking differently: Take 81 mg by mouth every evening. 09/22/20  Yes  Marcelino Duster, PA  metoprolol succinate (TOPROL XL) 25 MG 24 hr tablet Take 1 tablet (25 mg total) by mouth daily. 08/19/23  Yes [place], Peter M, MD  mexiletine (MEXITIL) 150 MG capsule Take 2 capsules (300 mg total) by mouth 2 (two) times daily. 08/12/23  Yes Marinus Maw, MD  Multiple Vitamins-Minerals (MULTIVITAMIN MEN 50+) TABS Take 1 tablet by mouth daily.   Yes  [provider]  pantoprazole (PROTONIX) 40 MG tablet Take 1 tablet (40 mg total) by mouth daily. Patient taking differently: Take 40 mg by mouth daily as needed (acid reflux). 08/19/23 08/13/24 Yes [place], Peter M, MD  rosuvastatin (CRESTOR) 40 MG tablet Take 1 tablet (40 mg total) by mouth daily. Patient taking differently: Take 40 mg by mouth every evening. 08/25/23  Yes Strader, [place] M, PA-C  sildenafil (REVATIO) 20 MG tablet Take 1 tablet (20 mg total) by mouth daily as needed. 08/19/23  Yes [place], Peter M, MD  testosterone cypionate (DEPOTESTOSTERONE CYPIONATE) 200 MG/ML injection Inject 100 mg into the muscle every 14 (fourteen) days. 04/19/23  Yes [provider]  VITAMIN A PO Take 1 capsule by mouth daily.   Yes [provider]    Allergies as of 01/05/2024 - Review Complete 12/29/2023  Allergen Reaction Noted   Penicillins Anaphylaxis 09/14/2016   Flexeril [cyclobenzaprine] Other (See Comments) 10/27/2016   Lipitor [atorvastatin] Diarrhea and Other (See Comments) 10/07/2020   Neurontin [gabapentin] Other (See Comments) 10/27/2016    Family History  Problem Relation Age of Onset   Cancer Mother    CAD Neg Hx     Social History   Socioeconomic History   Marital status: Single    Spouse name: Not on file   Number of children: Not on file   Years of education: Not on file   Highest education level: Not on file  Occupational History   Not on file  Tobacco Use   Smoking status: Some Days    Current packs/day: 0.00    Average packs/day: 1 pack/day for 40.0 years (40.0 ttl pk-yrs)    Types: Cigarettes    Start date: 04/1980    Last attempt to quit: 04/2020    Years since quitting: 3.7    Passive exposure: Current   Smokeless tobacco: Never  Vaping Use   Vaping status: Never Used  Substance and Sexual Activity   Alcohol use: Yes    Comment: half a pint of liquor Q3 days    Drug use: No   Sexual activity: Yes  Other Topics Concern    Not on file  Social History Narrative   Not on file   Social Drivers of Health   Financial Resource Strain: Not on file  Food Insecurity: Not on file  Transportation Needs: Not on file  Physical Activity: Not on file  Stress: Not on file  Social Connections: Unknown (09/14/2022)   Received from [facility], [facility]   Social Network    Social Network: Not on file  Intimate Partner Violence: Unknown (09/14/2022)   [facility] from [facility], [facility]   HITS    Physically Hurt: Not on file    Insult or Talk Down To: Not on file    Threaten Physical Harm: Not on file    Scream or Curse: Not on file    Review of Systems: See HPI, otherwise negative ROS  Physical Exam: Vital signs in last 24 hours: Temp:  [98.1 F (36.7 C)] 98.1 F (36.7 C) (03/20  1610) Pulse Rate:  [70] 70 (03/20 0928) Resp:  [18] 18 (03/20 0928) BP: (135)/(84) 135/84 (03/20 0928) SpO2:  [99 %] 99 % (03/20 0928) Weight:  [960 kg] 117 kg (03/20 0928)   General:   Alert,  Well-developed, well-nourished, pleasant and cooperative in NAD Head:  Normocephalic and atraumatic. Eyes:  Sclera clear, no icterus.   Conjunctiva pink. Ears:  Normal auditory acuity. Nose:  No deformity, discharge,  or lesions. Msk:  Symmetrical without gross deformities. Normal posture. Extremities:  Without clubbing or edema. Neurologic:  Alert and  oriented x4;  grossly normal neurologically. Skin:  Intact without significant lesions or rashes. Psych:  Alert and cooperative. Normal mood and affect.  Impression/Plan: Preston Ortiz is a 68 y.o. male is here for a surveillance colonoscopy for large polypectomy ( Tubular adenoma with high grade dysplasia )  .   The risks of the procedure including infection, bleed, or perforation as well as benefits, limitations, alternatives and imponderables have been reviewed with the patient. Questions have been answered. All parties agreeable.

## 2024-01-14 ENCOUNTER — Encounter (HOSPITAL_COMMUNITY): Payer: Self-pay | Admitting: Gastroenterology

## 2024-01-14 ENCOUNTER — Encounter (INDEPENDENT_AMBULATORY_CARE_PROVIDER_SITE_OTHER): Payer: Self-pay | Admitting: *Deleted

## 2024-01-14 LAB — SURGICAL PATHOLOGY

## 2024-01-17 ENCOUNTER — Encounter (INDEPENDENT_AMBULATORY_CARE_PROVIDER_SITE_OTHER): Payer: Self-pay | Admitting: *Deleted

## 2024-01-17 NOTE — Progress Notes (Signed)
 I reviewed the pathology results. Ann, can you send her a letter with the findings as described below please?  Repeat colonoscopy in 1 year repeat upper endoscopy 3 years.  Thanks,  Vista Lawman, MD Gastroenterology and Hepatology [facility]  ---------------------------------------------------------------------------------------------  [facility] [address], [address], [place], [place] [address] Phone:  [phone]   01/17/24 Preston Ortiz, [place]   Dear Preston Ortiz,  I am writing to inform you that the biopsies taken during your recent endoscopic examination showed:  A. COLON, SIGMOID, POLYPECTOMY:  - Hyperplastic polyp   B. ABNORMAL COLON MUCOSA, BIOPSY:  - Polypoid colonic mucosa with mild hyperplastic changes  - Pigmented material, consistent with tattoo ink  - Negative for dysplasia malignancy     What does this mean?  I am writing to let you know the results of your recent colonoscopy.  You had a total of 1 polyps removed. The pathology came back as "hyperplastic polyp." These findings are NOT cancer and do not turn into cancer.  Given these findings, it is recommended that your next colonoscopy be performed in 1 year as you previously had a large high risk polyp removed ( 5cm Tubular adenoma with High grade dysplasia)  Also I suggest you continue to take protonix 40mg  , 30 min before breakfast given last endoscopy showed you have Barretts esophagus   Also I value your feedback , so if you get a survey , please take the time to fill it out and thank you for choosing [facility]  Please call us at [phone] if you have persistent problems or have questions about your condition that have not been fully answered at this time.  Sincerely,  Vista Lawman, MD Gastroenterology and Hepatology

## 2024-04-18 ENCOUNTER — Telehealth: Payer: Self-pay | Admitting: Student

## 2024-04-18 ENCOUNTER — Other Ambulatory Visit: Payer: Self-pay | Admitting: Student

## 2024-04-18 MED ORDER — SILDENAFIL CITRATE 50 MG PO TABS: 50.0000 mg | ORAL_TABLET | Freq: Every day | ORAL | 3 refills | Status: DC | PRN

## 2024-04-18 NOTE — Telephone Encounter (Signed)
 Unsure why Revatio  had been sent in previously. I sent in Viagra  50mg  to take as needed. Can go up to 100mg  tablets if needed in the future. If no improvement, would recommend Urology referral. Please make sure he is ware to not take SL NTG within 48 hours of using it.   Thanks,  [place]   Patient informed and verbalized understanding.

## 2024-04-18 NOTE — Telephone Encounter (Signed)
 Pt c/o medication issue:  1. Name of Medication:  sildenafil  (REVATIO ) 20 MG tablet   2. How are you currently taking this medication (dosage and times per day)?   3. Are you having a reaction (difficulty breathing--STAT)?   4. What is your medication issue?   Patient says the medication worked a few months ago but it's no longer working. He says his PCP switched him to Cialis and he's tried different supplements and foods. He says it's worsened over the past few weeks. Patient requested to ask Preston Ortiz about this because he says it was discussed during his last visit.

## 2024-05-07 NOTE — Progress Notes (Unsigned)
 Cardiology Office Note    Date:  05/09/2024  ID:  Preston Ortiz, DOB 1956/07/22, MRN [id number] Cardiologist: Peter Swaziland, MD   EP: Dr. Waddell  History of Present Illness:    Preston Ortiz is a 68 y.o. male with past medical history of CAD (s/p NSTEMI in 08/2020 with DES to proximal-RCA), HTN, HLD and PVC's (prior monitor showing 39% PVC burden and improved to 5.6% by repeat monitor in 04/2021) who presents for 68-month follow-up.   He was last examined by myself in 07/2023 and denied any recent anginal symptoms at that time. He was planning to undergo hip surgery within the next few months and would not require further ischemic evaluation prior to this. Was continued on his current medical therapy with Amlodipine  5 mg daily, ASA 81 mg daily, Toprol -XL 25 mg daily, Mexiletine 300 mg twice daily and Crestor  40 mg daily.  In talking with the patient today, he reports overall feeling well since his last office visit. He denies any recent chest pain or dyspnea on exertion. No recent palpitations, orthopnea, PND or pitting edema. He does try to limit his caffeine intake and consumes alcoholic beverages on occasion. Has also reduced his tobacco use to less than 1/2 pack per day.  Studies Reviewed:   EKG: EKG is not ordered today. EKG from 10/31/2023 is reviewed and shows sinus bradycardia, HR 55 with no acute ST changes.    Cardiac Catheterization: 08/2020 Mid LAD to Dist LAD lesion is 30% stenosed. Prox RCA to Mid RCA lesion is 100% stenosed. 1st RPL lesion is 100% stenosed. Post intervention, there is a 0% residual stenosis. A drug-eluting stent was successfully placed using a STENT RESOLUTE ONYX 3.0X26. The left ventricular systolic function is normal. LV end diastolic pressure is normal. The left ventricular ejection fraction is 55-65% by visual estimate.   1. 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 2. Normal LV function 3. Normal LVEDP 4.  Successful PCI of the proximal RCA with DES x1.    Plan: DAPT for one year. Anticipate DC tomorrow.   Echocardiogram: 08/2020 IMPRESSIONS     1. Left ventricular ejection fraction, by estimation, is 50 to 55%. The  left ventricle has low normal function. The left ventricle demonstrates  regional wall motion abnormalities (see scoring diagram/findings for  description). Left ventricular diastolic   parameters are consistent with Grade I diastolic dysfunction (impaired  relaxation). There is moderate hypokinesis of the left ventricular, basal  inferoseptal wall, inferior wall and inferolateral wall.   2. Right ventricular systolic function is normal. The right ventricular  size is normal.   3. The mitral valve is normal in structure. No evidence of mitral valve  regurgitation. No evidence of mitral stenosis.   4. The aortic valve is normal in structure. Aortic valve regurgitation is  not visualized. No aortic stenosis is present.   5. The inferior vena cava is dilated in size with <50% respiratory  variability, suggesting right atrial pressure of 15 mmHg.   Event Monitor: 04/2021 1. NSR with sinus bradycardia 2. Occaisional PAC's and frequent PVC's. 3. No VT or SVT 4. No atrial fib or flutter   [name],MD   Patch Wear Time:  3 days and 0 hours (2022-07-05T18:07:17-0400 to 2022-07-08T18:07:47-0400)   Patient had a min HR of 43 bpm, max HR of 88 bpm, and avg HR of 58 bpm. Predominant underlying rhythm was Sinus Rhythm. Bundle Branch Block/IVCD was present. Isolated SVEs were rare (<1.0%), SVE  Couplets were rare (<1.0%), and no SVE Triplets were  present. Isolated VEs were frequent (5.6%, 13677), VE Couplets were rare (<1.0%, 313), and VE Triplets were rare (<1.0%, 1). Ventricular Bigeminy and Trigeminy were present.   Physical Exam:   VS:  BP 130/86 (BP Location: Left Arm, Cuff Size: Normal)   Pulse 60   Ht 5' 10.5 (1.791 m)   Wt 240 lb (108.9 kg)   SpO2 97%   BMI 33.95  kg/m    Wt Readings from Last 3 Encounters:  05/09/24 240 lb (108.9 kg)  01/13/24 258 lb (117 kg)  10/31/23 240 lb (108.9 kg)     GEN: Well nourished, well developed male appearing in no acute distress NECK: No JVD; No carotid bruits CARDIAC: RRR, no murmurs, rubs, gallops RESPIRATORY:  Clear to auscultation without rales, wheezing or rhonchi  ABDOMEN: Appears non-distended. No obvious abdominal masses. EXTREMITIES: No clubbing or cyanosis. No pitting edema.  Distal pedal pulses are 2+ bilaterally.   Assessment and Plan:   1. Coronary artery disease involving native coronary artery of native heart without angina pectoris - He previously had an NSTEMI in 08/2020 with DES to the proximal-RCA. He remains active at baseline and denies any recent anginal symptoms. Continue ASA 81 mg daily, Toprol -XL 25 mg daily and Crestor  40 mg daily.  2. PVC (premature ventricular contraction) - PVC burden had decreased to 5.6% by most recent monitor in 04/2021. He remains on Mexiletine 300 mg twice daily per EP and is also on Toprol -XL 25 mg daily. Has overall been tolerating well and denies any recent palpitations.  3. Essential hypertension - BP is at 130/86 during today's visit. Continue Amlodipine  5 mg daily and Toprol -XL 25 mg daily.  4. Hyperlipidemia with target LDL less than 70 - Followed by PCP.  LDL was at 38 when checked in 02/2024. LFT's WNL. Continue current medical therapy with Crestor  40 mg daily.  5. Erectile Dysfunction - Updated Rx for Sildenafil  50 mg as needed. We reviewed that he should not take this within 48 hours of utilizing nitroglycerin  but has not had to use this in several years.   Signed, Laymon CHRISTELLA Qua, PA-C

## 2024-05-09 ENCOUNTER — Ambulatory Visit: Attending: Student | Admitting: Student

## 2024-05-09 ENCOUNTER — Encounter: Payer: Self-pay | Admitting: Student

## 2024-05-09 VITALS — BP 130/86 | HR 60 | Ht 70.5 in | Wt 240.0 lb

## 2024-05-09 DIAGNOSIS — I10 Essential (primary) hypertension: Secondary | ICD-10-CM | POA: Diagnosis present

## 2024-05-09 DIAGNOSIS — I493 Ventricular premature depolarization: Secondary | ICD-10-CM | POA: Diagnosis present

## 2024-05-09 DIAGNOSIS — N529 Male erectile dysfunction, unspecified: Secondary | ICD-10-CM | POA: Insufficient documentation

## 2024-05-09 DIAGNOSIS — E785 Hyperlipidemia, unspecified: Secondary | ICD-10-CM | POA: Diagnosis present

## 2024-05-09 DIAGNOSIS — I2510 Atherosclerotic heart disease of native coronary artery without angina pectoris: Secondary | ICD-10-CM | POA: Insufficient documentation

## 2024-05-09 MED ORDER — METOPROLOL SUCCINATE ER 25 MG PO TB24: 25.0000 mg | ORAL_TABLET | Freq: Every day | ORAL | 3 refills | Status: AC

## 2024-05-09 MED ORDER — SILDENAFIL CITRATE 50 MG PO TABS: 50.0000 mg | ORAL_TABLET | Freq: Every day | ORAL | 3 refills | Status: AC | PRN

## 2024-05-09 MED ORDER — AMLODIPINE BESYLATE 5 MG PO TABS: 5.0000 mg | ORAL_TABLET | Freq: Every day | ORAL | 3 refills | Status: AC

## 2024-05-09 MED ORDER — ROSUVASTATIN CALCIUM 40 MG PO TABS: 40.0000 mg | ORAL_TABLET | Freq: Every day | ORAL | 3 refills | Status: AC

## 2024-05-09 NOTE — Patient Instructions (Signed)
 Medication Instructions:  Your physician recommends that you continue on your current medications as directed. Please refer to the Current Medication list given to you today.  *If you need a refill on your cardiac medications before your next appointment, please call your pharmacy*  Lab Work: NONE   If you have labs (blood work) drawn today and your tests are completely normal, you will receive your results only by: MyChart Message (if you have MyChart) OR A paper copy in the mail If you have any lab test that is abnormal or we need to change your treatment, we will call you to review the results.  Testing/Procedures: NONE   Follow-Up: At [facility], you and your health needs are our priority.  As part of our continuing mission to provide you with exceptional heart care, our providers are all part of one team.  This team includes your primary Cardiologist (physician) and Advanced Practice Providers or APPs (Physician Assistants and Nurse Practitioners) who all work together to provide you with the care you need, when you need it.  Your next appointment:   6 month(s)  Provider:   You may see Peter Swaziland, MD or one of the following Advanced Practice Providers on your designated Care Team:   Laymon Qua, PA-C  [place], [place] Olivia P[place], [place]     We recommend signing up for the patient portal called MyChart.  Sign up information is provided on this After Visit Summary.  MyChart is used to connect with patients for Virtual Visits (Telemedicine).  Patients are able to view lab/test results, encounter notes, upcoming appointments, etc.  Non-urgent messages can be sent to your provider as well.   To learn more about what you can do with MyChart, go to [email or web address].   Other Instructions Thank you for choosing [facility]!

## 2024-05-31 ENCOUNTER — Other Ambulatory Visit: Payer: Self-pay

## 2024-05-31 ENCOUNTER — Encounter (HOSPITAL_COMMUNITY): Payer: Self-pay

## 2024-05-31 ENCOUNTER — Encounter (HOSPITAL_COMMUNITY)
Admission: RE | Admit: 2024-05-31 | Discharge: 2024-05-31 | Disposition: A | Discharge status: Home / Self Care | Source: Ambulatory Visit | Attending: Ophthalmology | Admitting: Ophthalmology

## 2024-05-31 NOTE — H&P (Signed)
 Surgical History & Physical  Patient Name: Preston Ortiz  DOB: [birth year]  Surgery: Cataract extraction with intraocular lens implant phacoemulsification; Right Eye Surgeon: Lynwood Hermann MD Surgery Date: 06/05/2024 Pre-Op Date: 04/06/2024  HPI: A 38 Yr. old male patient 1. The patient complains of difficulty when viewing TV, reading closed caption, news scrolls on TV, which began 6 months ago. Both eyes are affected. The episode is constant. The patient describes glare and hazy symptoms affecting their eyes/vision. presents today for a Cataract eval. LEE - 01/06/24. Referred by: Dr FLEETA.Since previous exam, states OD is worse OS. denies FOL/FL. denies any ocular discomfort OU. no use of drops. reports within the last year cataract OD has progressively worsened, reports being unable to read small print on books/labels. reports difficulty reading traffic signs/street signs. and has noticed night vision worsening. This is negatively affecting the patient's quality of life and the patient is unable to function adequately in life with the current level of vision. HPI Completed by Dr. Lynwood Hermann  Medical History: Cataracts  Heart Problem High Blood Pressure  Review of Systems Negative Allergic/Immunologic Hypertension Cardiovascular Negative Constitutional Negative Ear, Nose, Mouth & Throat Negative Endocrine Negative Eyes Negative Gastrointestinal Negative Genitourinary Negative Hemotologic/Lymphatic Negative Integumentary Negative Musculoskeletal Negative Neurological Negative Psychiatry Negative Respiratory  Social Light tobacco smoker Alcohol every now and then  Medication Prednisolone-moxiflox-bromfen,  Amlodipine , Metoprolol  succinate, Pantoprazole , Rosuvastatin , Sildenafil  (pulm.hypertension), Tadalafil, GaviLyte-N , Mexiletine  Sx/Procedures Stent sx  Drug Allergies  Gabapentin, Penicillin  History & Physical: Heent: cataracts NECK: supple without bruits LUNGS:  lungs clear to auscultation CV: regular rate and rhythm Abdomen: soft and non-tender  Impression & Plan: Assessment: 1.  COMBINED FORMS AGE RELATED CATARACT; Both Eyes (H25.813) 2.  BLEPHARITIS; Right Upper Lid, Right Lower Lid, Left Upper Lid, Left Lower Lid (H01.001, H01.002,H01.004,H01.005) 3.  DERMATOCHALASIS, no surgery; Right Upper Lid, Left Upper Lid (H02.831, H02.834) 4.  Pinguecula; Both Eyes (H11.153) 5.  ARCUS SENILIS; Both Eyes (H18.413) 6.  ASTIGMATISM, REGULAR; Both Eyes (H52.223)  Plan: 1.  Cataract accounts for the patient's decreased vision. This visual impairment is not correctable with a tolerable change in glasses or contact lenses. Cataract surgery with an implantation of a new lens should significantly improve the visual and functional status of the patient.Discussed all risks, benefits, alternatives, and potential complications. Discussed the procedures and recovery. Patient desires to have surgery. A-scan ordered and performed today for intra-ocular lens calculations. The surgery will be performed in order to improve vision for driving, reading, and for eye examinations. Recommend phacoemulsification with intra-ocular lens. Recommend Dextenza  for post-operative pain and inflammation. History of refractive Surgery: None Use of Eye Pressure Lowering Drops: None Right Eye worse. OD first, then OS. Dilates poorly - shugarcaine or Lidocaine +Omidira by protocol Standard lens. Malyugin Ring.  2.  Blepharitis is present - recommend regular lid cleaning.  3.  Asymptomatic, recommend observation for now. Findings, prognosis and treatment options reviewed.  4.  Observe; Artificial tears as needed for irritation.  5.  Monitor.  6.  Mild. Toric Lens deferred.

## 2024-06-13 ENCOUNTER — Encounter (HOSPITAL_COMMUNITY)
Admission: RE | Admit: 2024-06-13 | Discharge: 2024-06-13 | Disposition: A | Discharge status: Home / Self Care | Source: Ambulatory Visit | Attending: Ophthalmology | Admitting: Ophthalmology

## 2024-06-13 ENCOUNTER — Encounter (HOSPITAL_COMMUNITY): Payer: Self-pay

## 2024-06-13 NOTE — H&P (Signed)
 Surgical History & Physical  Patient Name: Preston Ortiz  DOB: Jun 25, 1956  Surgery: Cataract extraction with intraocular lens implant phacoemulsification; Right Eye Surgeon: Lynwood Hermann MD Surgery Date: 06/19/2024 Pre-Op Date: 04/06/2024  HPI: A 48 Yr. old male patient 1. The patient complains of difficulty when viewing TV, reading closed caption, news scrolls on TV, which began 6 months ago. Both eyes are affected. The episode is constant. The patient describes glare and hazy symptoms affecting their eyes/vision. presents today for a Cataract eval. LEE - 01/06/24. Referred by: Dr FLEETA. Since previous exam, states OD is worse OS. denies FOL/FL. denies any ocular discomfort OU. no use of drops. reports within the last year cataract OD has progressively worsened, reports being unable to read small print on books/labels. reports difficulty reading traffic signs/street signs. and has noticed night vision worsening. This is negatively affecting the patient's quality of life and the patient is unable to function adequately in life with the current level of vision. HPI Completed by Dr. Lynwood Hermann  Medical History: Cataracts  Heart Problem High Blood Pressure  Review of Systems Negative Allergic/Immunologic Heart problems, Hypertension Cardiovascular Negative Constitutional Negative Ear, Nose, Mouth & Throat Negative Endocrine Negative Eyes Negative Gastrointestinal Negative Genitourinary Negative Hemotologic/Lymphatic Negative Integumentary Negative Musculoskeletal Negative Neurological Negative Psychiatry Negative Respiratory  Social Light tobacco smoker  Alcohol every now and then  Medication Prednisolone-moxiflox-bromfen,  Amlodipine , Metoprolol  succinate, Pantoprazole , Rosuvastatin , Sildenafil  (pulm.hypertension), Tadalafil, GaviLyte-N , Mexiletine   Sx/Procedures Stent sx  Drug Allergies  Gabapentin, Penicillin  History & Physical: Heent: cataracts NECK: supple  without bruits LUNGS: lungs clear to auscultation CV: regular rate and rhythm Abdomen: soft and non-tender  Impression & Plan: Assessment: 1.  COMBINED FORMS AGE RELATED CATARACT; Both Eyes (H25.813) 2.  BLEPHARITIS; Right Upper Lid, Right Lower Lid, Left Upper Lid, Left Lower Lid (H01.001, H01.002,H01.004,H01.005) 3.  DERMATOCHALASIS, no surgery; Right Upper Lid, Left Upper Lid (H02.831, H02.834) 4.  Pinguecula; Both Eyes (H11.153) 5.  ARCUS SENILIS; Both Eyes (H18.413) 6.  ASTIGMATISM, REGULAR; Both Eyes (H52.223)  Plan: 1.  Cataract accounts for the patient's decreased vision. This visual impairment is not correctable with a tolerable change in glasses or contact lenses. Cataract surgery with an implantation of a new lens should significantly improve the visual and functional status of the patient.Discussed all risks, benefits, alternatives, and potential complications. Discussed the procedures and recovery. Patient desires to have surgery. A-scan ordered and performed today for intra-ocular lens calculations. The surgery will be performed in order to improve vision for driving, reading, and for eye examinations. Recommend phacoemulsification with intra-ocular lens. Recommend Dextenza  for post-operative pain and inflammation. History of refractive Surgery: None Use of Eye Pressure Lowering Drops: None Right Eye worse. OD first, then OS. Dilates poorly - shugarcaine or Lidocaine +Omidira by protocol Standard lens. Malyugin Ring.  2.  Blepharitis is present - recommend regular lid cleaning.  3.  Asymptomatic, recommend observation for now. Findings, prognosis and treatment options reviewed.  4.  Observe; Artificial tears as needed for irritation.  5.  Monitor.  6.  Mild. Toric Lens deferred.

## 2024-06-19 ENCOUNTER — Encounter (HOSPITAL_COMMUNITY)
Admission: RE | Disposition: A | Discharge status: Home / Self Care | Payer: Self-pay | Source: Home / Self Care | Attending: Ophthalmology

## 2024-06-19 ENCOUNTER — Ambulatory Visit (HOSPITAL_COMMUNITY): Admitting: Anesthesiology

## 2024-06-19 ENCOUNTER — Ambulatory Visit (HOSPITAL_COMMUNITY)
Admission: RE | Admit: 2024-06-19 | Discharge: 2024-06-19 | Disposition: A | Discharge status: Home / Self Care | Attending: Ophthalmology | Admitting: Ophthalmology

## 2024-06-19 ENCOUNTER — Ambulatory Visit (HOSPITAL_BASED_OUTPATIENT_CLINIC_OR_DEPARTMENT_OTHER): Admitting: Anesthesiology

## 2024-06-19 DIAGNOSIS — H5711 Ocular pain, right eye: Secondary | ICD-10-CM | POA: Diagnosis present

## 2024-06-19 DIAGNOSIS — I2510 Atherosclerotic heart disease of native coronary artery without angina pectoris: Secondary | ICD-10-CM

## 2024-06-19 DIAGNOSIS — I10 Essential (primary) hypertension: Secondary | ICD-10-CM | POA: Diagnosis not present

## 2024-06-19 DIAGNOSIS — H25811 Combined forms of age-related cataract, right eye: Secondary | ICD-10-CM | POA: Insufficient documentation

## 2024-06-19 DIAGNOSIS — K219 Gastro-esophageal reflux disease without esophagitis: Secondary | ICD-10-CM | POA: Diagnosis not present

## 2024-06-19 DIAGNOSIS — I252 Old myocardial infarction: Secondary | ICD-10-CM | POA: Insufficient documentation

## 2024-06-19 DIAGNOSIS — F17210 Nicotine dependence, cigarettes, uncomplicated: Secondary | ICD-10-CM | POA: Insufficient documentation

## 2024-06-19 DIAGNOSIS — F17200 Nicotine dependence, unspecified, uncomplicated: Secondary | ICD-10-CM | POA: Diagnosis not present

## 2024-06-19 HISTORY — PX: INSERTION, STENT, DRUG-ELUTING, LACRIMAL CANALICULUS: SHX7453

## 2024-06-19 HISTORY — PX: CATARACT EXTRACTION W/PHACO: SHX586

## 2024-06-19 SURGERY — PHACOEMULSIFICATION, CATARACT, WITH IOL INSERTION
Anesthesia: Monitor Anesthesia Care | Site: Eye | Laterality: Right

## 2024-06-19 MED ORDER — MIDAZOLAM HCL 2 MG/2ML IJ SOLN
INTRAMUSCULAR | Status: AC
  Filled 2024-06-19: qty 2

## 2024-06-19 MED ORDER — LIDOCAINE HCL 3.5 % OP GEL
1.0000 | Freq: Once | OPHTHALMIC | Status: AC
  Administered 2024-06-19: 1 via OPHTHALMIC

## 2024-06-19 MED ORDER — MOXIFLOXACIN HCL 5 MG/ML IO SOLN
INTRAOCULAR | Status: DC | PRN
  Administered 2024-06-19: .2 mL via OPHTHALMIC

## 2024-06-19 MED ORDER — SODIUM HYALURONATE 10 MG/ML IO SOLUTION
PREFILLED_SYRINGE | INTRAOCULAR | Status: DC | PRN
  Administered 2024-06-19: .85 mL via INTRAOCULAR

## 2024-06-19 MED ORDER — TROPICAMIDE 1 % OP SOLN
1.0000 [drp] | OPHTHALMIC | Status: AC | PRN
  Administered 2024-06-19 (×3): 1 [drp] via OPHTHALMIC

## 2024-06-19 MED ORDER — LIDOCAINE HCL (PF) 1 % IJ SOLN
INTRAMUSCULAR | Status: DC | PRN
  Administered 2024-06-19: 5 mL

## 2024-06-19 MED ORDER — POVIDONE-IODINE 5 % OP SOLN
OPHTHALMIC | Status: DC | PRN
  Administered 2024-06-19: 1 via OPHTHALMIC

## 2024-06-19 MED ORDER — STERILE WATER FOR IRRIGATION IR SOLN
Status: DC | PRN
  Administered 2024-06-19: 250 mL

## 2024-06-19 MED ORDER — LACTATED RINGERS IV SOLN: INTRAVENOUS | Status: DC

## 2024-06-19 MED ORDER — DEXAMETHASONE 0.4 MG OP INST
VAGINAL_INSERT | OPHTHALMIC | Status: DC | PRN
  Administered 2024-06-19: .4 mg via OPHTHALMIC

## 2024-06-19 MED ORDER — PHENYLEPHRINE HCL 2.5 % OP SOLN
1.0000 [drp] | OPHTHALMIC | Status: AC | PRN
  Administered 2024-06-19 (×3): 1 [drp] via OPHTHALMIC

## 2024-06-19 MED ORDER — BSS IO SOLN
INTRAOCULAR | Status: DC | PRN
Start: 2024-06-19 — End: 2024-06-19
  Administered 2024-06-19: 15 mL via INTRAOCULAR

## 2024-06-19 MED ORDER — TETRACAINE HCL 0.5 % OP SOLN
1.0000 [drp] | OPHTHALMIC | Status: AC | PRN
  Administered 2024-06-19 (×3): 1 [drp] via OPHTHALMIC

## 2024-06-19 MED ORDER — PHENYLEPHRINE-KETOROLAC 1-0.3 % IO SOLN
INTRAOCULAR | Status: DC | PRN
  Administered 2024-06-19: 500 mL via OPHTHALMIC

## 2024-06-19 MED ORDER — DEXAMETHASONE 0.4 MG OP INST
VAGINAL_INSERT | OPHTHALMIC | Status: AC
  Filled 2024-06-19: qty 1

## 2024-06-19 MED ORDER — SODIUM HYALURONATE 23MG/ML IO SOSY
PREFILLED_SYRINGE | INTRAOCULAR | Status: DC | PRN
  Administered 2024-06-19: .6 mL via INTRAOCULAR

## 2024-06-19 SURGICAL SUPPLY — 12 items
CLOTH BEACON ORANGE TIMEOUT ST (SAFETY) ×1 IMPLANT
EYE SHIELD UNIVERSAL CLEAR (GAUZE/BANDAGES/DRESSINGS) IMPLANT
FEE CATARACT SUITE SIGHTPATH (MISCELLANEOUS) ×1 IMPLANT
GLOVE BIOGEL PI IND STRL 7.0 (GLOVE) ×2 IMPLANT
KIT SLEEVE INFUSION .9 MICRO (MISCELLANEOUS) IMPLANT
LENS IOL TECNIS EYHANCE 20.5 (Intraocular Lens) IMPLANT
NDL HYPO 18GX1.5 BLUNT FILL (NEEDLE) ×1 IMPLANT
NEEDLE HYPO 18GX1.5 BLUNT FILL (NEEDLE) ×1 IMPLANT
PAD ARMBOARD POSITIONER FOAM (MISCELLANEOUS) ×1 IMPLANT
SYR TB 1ML LL NO SAFETY (SYRINGE) ×1 IMPLANT
TAPE SURG TRANSPORE 1 IN (GAUZE/BANDAGES/DRESSINGS) IMPLANT
WATER STERILE IRR 250ML POUR (IV SOLUTION) ×1 IMPLANT

## 2024-06-19 NOTE — Transfer of Care (Signed)
 Immediate Anesthesia Transfer of Care Note  Patient: Preston Ortiz  Procedure(s) Performed: PHACOEMULSIFICATION, CATARACT, WITH IOL INSERTION (Right: Eye) INSERTION, STENT, DRUG-ELUTING, LACRIMAL CANALICULUS (Right: Eye)  Patient Location: Short Stay  Anesthesia Type:MAC  Level of Consciousness: awake, alert , oriented, and patient cooperative  Airway & Oxygen Therapy: Patient Spontanous Breathing  Post-op Assessment: Report given to RN, Post -op Vital signs reviewed and stable, and Patient moving all extremities X 4  Post vital signs: Reviewed and stable  Last Vitals:  Vitals Value Taken Time  BP 164/93 06/19/24 09:24  Temp 36.4 C 06/19/24 09:24  Pulse 66 06/19/24 09:24  Resp 12 06/19/24 09:24  SpO2 97 % 06/19/24 09:24    Last Pain:  Vitals:   06/19/24 0924  TempSrc: Oral  PainSc: 0-No pain      Patients Stated Pain Goal: 5 (06/19/24 0758)  Complications: No notable events documented.

## 2024-06-19 NOTE — Discharge Instructions (Signed)
 Please discharge patient when stable, will follow up today with Dr. June Leap at the [facility] office immediately following discharge.  Leave shield in place until visit.  All paperwork with discharge instructions will be given at the office.  [facility] Address:  [address]  [place], [place] [address]

## 2024-06-19 NOTE — Op Note (Signed)
 Date of procedure: 06/19/24  Pre-operative diagnosis:  Visually significant combined form age-related cataract, Right Eye (H25.811)  Post-operative diagnosis:   1. Visually significant combined form age-related cataract, Right Eye (H25.811) 2. Pain and inflammation following cataract surgery Right Eye (H57.11)  Procedure:  Removal of cataract via phacoemulsification and insertion of intra-ocular lens [employer] DIB00 +20.5D into the capsular bag of the Right Eye 2. Placement of Dextenza  insert, Right Eye  Attending surgeon: Lynwood LABOR. Adlyn Fife, MD, MA  Anesthesia: MAC, Topical Akten   Complications: None  Estimated Blood Loss: <44mL (minimal)  Specimens: None  Implants: As above  Indications:  Visually significant age-related cataract, Right Eye  Procedure:  The patient was seen and identified in the pre-operative area. The operative eye was identified and dilated.  The operative eye was marked.  Topical anesthesia was administered to the operative eye.     The patient was then to the operative suite and placed in the supine position.  A timeout was performed confirming the patient, procedure to be performed, and all other relevant information.   The patient's face was prepped and draped in the usual fashion for intra-ocular surgery.  A lid speculum was placed into the operative eye and the surgical microscope moved into place and focused.  A superotemporal paracentesis was created using a 20 gauge paracentesis blade. Omidria  was injected into the anterior chamber. Shugarcaine was injected into the anterior chamber.  Viscoelastic was injected into the anterior chamber.  A temporal clear-corneal main wound incision was created using a 2.85mm microkeratome.  A continuous curvilinear capsulorrhexis was initiated using an irrigating cystitome and completed using capsulorrhexis forceps.  Hydrodissection and hydrodeliniation were performed.  Viscoelastic was injected into the anterior  chamber.  A phacoemulsification handpiece and a chopper as a second instrument were used to remove the nucleus and epinucleus. The irrigation/aspiration handpiece was used to remove any remaining cortical material.   The capsular bag was reinflated with viscoelastic, checked, and found to be intact.  The intraocular lens was inserted into the capsular bag.  The irrigation/aspiration handpiece was used to remove any remaining viscoelastic.  The clear corneal wound and paracentesis wounds were then hydrated and checked with Weck-Cels to be watertight. 0.1mL of moxifloxacin  was injected into the anterior chamber.  The lid-speculum was removed. The lower punctum was dilated. A Dextenza  implant was placed in the lower canaliculus without complication.  The drape was removed.  The patient's face was cleaned with a wet and dry 4x4. A clear shield was taped over the eye. The patient was taken to the post-operative care unit in good condition, having tolerated the procedure well.  Post-Op Instructions: The patient will follow up at [facility] for a same day post-operative evaluation and will receive all other orders and instructions.

## 2024-06-19 NOTE — Interval H&P Note (Signed)
 History and Physical Interval Note:  06/19/2024 8:56 AM  Preston Ortiz Finical  has presented today for surgery, with the diagnosis of combined forms age related cataract, right eye.  The various methods of treatment have been discussed with the patient and family. After consideration of risks, benefits and other options for treatment, the patient has consented to  Procedure(s): PHACOEMULSIFICATION, CATARACT, WITH IOL INSERTION (Right) INSERTION, STENT, DRUG-ELUTING, LACRIMAL CANALICULUS (Right) as a surgical intervention.  The patient's history has been reviewed, patient examined, no change in status, stable for surgery.  I have reviewed the patient's chart and labs.  Questions were answered to the patient's satisfaction.     HARRIE AGENT

## 2024-06-19 NOTE — Anesthesia Preprocedure Evaluation (Signed)
 Anesthesia Evaluation  Patient identified by MRN, date of birth, ID band Patient awake    Reviewed: Allergy & Precautions, H&P , NPO status , Patient's Chart, lab work & pertinent test results, reviewed documented beta blocker date and time   Airway Mallampati: II  TM Distance: >3 FB Neck ROM: full    Dental no notable dental hx.    Pulmonary neg pulmonary ROS, Current Smoker and Patient abstained from smoking.   Pulmonary exam normal breath sounds clear to auscultation       Cardiovascular Exercise Tolerance: Good hypertension, + CAD and + Past MI  + dysrhythmias  Rhythm:regular Rate:Normal     Neuro/Psych negative neurological ROS  negative psych ROS   GI/Hepatic Neg liver ROS,GERD  ,,  Endo/Other  negative endocrine ROS    Renal/GU negative Renal ROS  negative genitourinary   Musculoskeletal   Abdominal   Peds  Hematology negative hematology ROS (+)   Anesthesia Other Findings   Reproductive/Obstetrics negative OB ROS                              Anesthesia Physical Anesthesia Plan  ASA: 3  Anesthesia Plan: MAC   Post-op Pain Management:    Induction:   PONV Risk Score and Plan:   Airway Management Planned:   Additional Equipment:   Intra-op Plan:   Post-operative Plan:   Informed Consent: I have reviewed the patients History and Physical, chart, labs and discussed the procedure including the risks, benefits and alternatives for the proposed anesthesia with the patient or authorized representative who has indicated his/her understanding and acceptance.     Dental Advisory Given  Plan Discussed with: CRNA  Anesthesia Plan Comments:         Anesthesia Quick Evaluation

## 2024-06-20 ENCOUNTER — Encounter (HOSPITAL_COMMUNITY): Payer: Self-pay | Admitting: Ophthalmology

## 2024-06-24 NOTE — Anesthesia Postprocedure Evaluation (Signed)
 Anesthesia Post Note  Patient: Preston Ortiz  Procedure(s) Performed: PHACOEMULSIFICATION, CATARACT, WITH IOL INSERTION (Right: Eye) INSERTION, STENT, DRUG-ELUTING, LACRIMAL CANALICULUS (Right: Eye)  Patient location during evaluation: Phase II Anesthesia Type: MAC Level of consciousness: awake Pain management: pain level controlled Vital Signs Assessment: post-procedure vital signs reviewed and stable Respiratory status: spontaneous breathing and respiratory function stable Cardiovascular status: blood pressure returned to baseline and stable Postop Assessment: no headache and no apparent nausea or vomiting Anesthetic complications: no Comments: Late entry   No notable events documented.   Last Vitals:  Vitals:   06/19/24 0758 06/19/24 0924  BP: (!) 154/83 (!) 164/93  Pulse: 61 66  Resp: 18 12  Temp: 36.6 C (!) 36.4 C  SpO2: 96% 97%    Last Pain:  Vitals:   06/20/24 1544  TempSrc:   PainSc: 0-No pain                 Yvonna JINNY Bosworth

## 2024-07-12 NOTE — H&P (Signed)
 Surgical History & Physical  Patient Name: Preston Ortiz  DOB: 08-Sep-1956  Surgery: Cataract extraction with intraocular lens implant phacoemulsification; Left Eye Surgeon: Lynwood Hermann MD Surgery Date: 07/17/2024 Pre-Op Date: 07/06/2024  HPI: A 78 Yr. old male patient 1. The patient is returning after cataract surgery. The right eye is affected. Status post cataract surgery, which began 6 days ago: Since the last visit, the affected area feels improvement. The patient's vision is improved. The condition's severity is constant. Patient is following medication instructions. 2. The patient is returning for a cataract follow-up of the left eye. Since the last visit, the affected area is tolerating. The patient's vision is blurry. The condition's severity is constant. Patient is not taking medications. This is negatively affecting the patient's quality of life and the patient is unable to function adequately in life with the current level of vision. HPI was performed by Lynwood Hermann .  Medical History: Cataracts  Heart Problem  Review of Systems High Blood Pressure Negative Allergic/Immunologic Heart problems Cardiovascular Negative Constitutional Negative Ear, Nose, Mouth & Throat Negative Endocrine Negative Eyes Negative Gastrointestinal Negative Genitourinary Negative Hematologic/Lymphatic Negative Integumentary Negative Musculoskeletal Negative Neurological Negative Psychiatry Negative Respiratory  Social Light tobacco smoker  Alcohol every now and then  Medication Prednisolone-moxiflox-bromfen,  Amlodipine , Metoprolol  succinate, Pantoprazole , Rosuvastatin , Sildenafil  (pulm.hypertension), Tadalafil, GaviLyte-N , Mexiletine  Sx/Procedures Phaco c IOL OD - Dextenza ,  Stent sx  Drug Allergies  Gabapentin, Penicillin  History & Physical: Heent: cataract NECK: supple without bruits LUNGS: lungs clear to auscultation CV: regular rate and rhythm Abdomen: soft and  non-tender  Impression & Plan: Assessment: 1.  CATARACT EXTRACTION STATUS; Right Eye (Z98.41) 2.  COMBINED FORMS AGE RELATED CATARACT; Left Eye (H25.812) 3.  PCO; Right Eye (H26.491)  Plan: 1.  1 week after cataract surgery. Doing well with improved vision and normal eye pressure. Call with any problems or concerns. Continue Pred-Mox-Brom Combo drop 2x/day for 1 more week - Dextenza .  2.  Cataract accounts for the patient's decreased vision. This visual impairment is not correctable with a tolerable change in glasses or contact lenses. Cataract surgery with an implantation of a new lens should significantly improve the visual and functional status of the patient.Discussed all risks, benefits, alternatives, and potential complications. Discussed the procedures and recovery. Patient desires to have surgery. A-scan ordered and performed today for intra-ocular lens calculations. The surgery will be performed in order to improve vision for driving, reading, and for eye examinations. Recommend phacoemulsification with intra-ocular lens. Recommend Dextenza  for post-operative pain and inflammation. History of refractive Surgery: None Use of Eye Pressure Lowering Drops: None Left Eye. Dilates poorly - shugarcaine or Lidocaine +Omidira by protocol Standard lens. Malyugin Ring.  3.  Asymptomatic. Findings, prognosis and treatment options reviewed. No indication for laser at this point, will observe for changes.

## 2024-07-13 ENCOUNTER — Encounter (HOSPITAL_COMMUNITY): Payer: Self-pay

## 2024-07-13 ENCOUNTER — Other Ambulatory Visit: Payer: Self-pay

## 2024-07-13 ENCOUNTER — Encounter (HOSPITAL_COMMUNITY)
Admission: RE | Admit: 2024-07-13 | Discharge: 2024-07-13 | Disposition: A | Discharge status: Home / Self Care | Source: Ambulatory Visit | Attending: Ophthalmology | Admitting: Ophthalmology

## 2024-07-17 ENCOUNTER — Encounter (HOSPITAL_COMMUNITY): Payer: Self-pay | Admitting: Ophthalmology

## 2024-07-17 ENCOUNTER — Encounter (HOSPITAL_COMMUNITY)
Admission: RE | Disposition: A | Discharge status: Home / Self Care | Payer: Self-pay | Source: Home / Self Care | Attending: Ophthalmology

## 2024-07-17 ENCOUNTER — Ambulatory Visit (HOSPITAL_COMMUNITY)
Admission: RE | Admit: 2024-07-17 | Discharge: 2024-07-17 | Disposition: A | Discharge status: Home / Self Care | Attending: Ophthalmology | Admitting: Ophthalmology

## 2024-07-17 ENCOUNTER — Other Ambulatory Visit: Payer: Self-pay

## 2024-07-17 ENCOUNTER — Ambulatory Visit (HOSPITAL_COMMUNITY): Admitting: Anesthesiology

## 2024-07-17 ENCOUNTER — Ambulatory Visit (HOSPITAL_BASED_OUTPATIENT_CLINIC_OR_DEPARTMENT_OTHER): Admitting: Anesthesiology

## 2024-07-17 DIAGNOSIS — F17210 Nicotine dependence, cigarettes, uncomplicated: Secondary | ICD-10-CM | POA: Diagnosis not present

## 2024-07-17 DIAGNOSIS — F17200 Nicotine dependence, unspecified, uncomplicated: Secondary | ICD-10-CM | POA: Diagnosis not present

## 2024-07-17 DIAGNOSIS — H25812 Combined forms of age-related cataract, left eye: Secondary | ICD-10-CM | POA: Diagnosis present

## 2024-07-17 DIAGNOSIS — I499 Cardiac arrhythmia, unspecified: Secondary | ICD-10-CM | POA: Diagnosis not present

## 2024-07-17 DIAGNOSIS — I252 Old myocardial infarction: Secondary | ICD-10-CM | POA: Insufficient documentation

## 2024-07-17 DIAGNOSIS — I10 Essential (primary) hypertension: Secondary | ICD-10-CM | POA: Diagnosis not present

## 2024-07-17 DIAGNOSIS — Z9841 Cataract extraction status, right eye: Secondary | ICD-10-CM | POA: Diagnosis not present

## 2024-07-17 DIAGNOSIS — K219 Gastro-esophageal reflux disease without esophagitis: Secondary | ICD-10-CM | POA: Diagnosis not present

## 2024-07-17 DIAGNOSIS — I2510 Atherosclerotic heart disease of native coronary artery without angina pectoris: Secondary | ICD-10-CM | POA: Insufficient documentation

## 2024-07-17 DIAGNOSIS — H26491 Other secondary cataract, right eye: Secondary | ICD-10-CM | POA: Insufficient documentation

## 2024-07-17 DIAGNOSIS — H5712 Ocular pain, left eye: Secondary | ICD-10-CM | POA: Insufficient documentation

## 2024-07-17 HISTORY — PX: CATARACT EXTRACTION W/PHACO: SHX586

## 2024-07-17 HISTORY — PX: INSERTION, STENT, DRUG-ELUTING, LACRIMAL CANALICULUS: SHX7453

## 2024-07-17 SURGERY — PHACOEMULSIFICATION, CATARACT, WITH IOL INSERTION
Anesthesia: Monitor Anesthesia Care | Site: Eye | Laterality: Left

## 2024-07-17 MED ORDER — PHENYLEPHRINE HCL 2.5 % OP SOLN
1.0000 [drp] | OPHTHALMIC | Status: AC | PRN
  Administered 2024-07-17 (×3): 1 [drp] via OPHTHALMIC

## 2024-07-17 MED ORDER — LIDOCAINE HCL (PF) 1 % IJ SOLN
INTRAMUSCULAR | Status: DC | PRN
  Administered 2024-07-17: 2 mL

## 2024-07-17 MED ORDER — DEXAMETHASONE 0.4 MG OP INST
VAGINAL_INSERT | OPHTHALMIC | Status: DC | PRN
  Administered 2024-07-17: .4 mg via OPHTHALMIC

## 2024-07-17 MED ORDER — LIDOCAINE HCL 3.5 % OP GEL
1.0000 | Freq: Once | OPHTHALMIC | Status: AC
  Administered 2024-07-17: 1 via OPHTHALMIC

## 2024-07-17 MED ORDER — TROPICAMIDE 1 % OP SOLN
1.0000 [drp] | OPHTHALMIC | Status: AC | PRN
  Administered 2024-07-17 (×3): 1 [drp] via OPHTHALMIC

## 2024-07-17 MED ORDER — TETRACAINE HCL 0.5 % OP SOLN
1.0000 [drp] | OPHTHALMIC | Status: AC | PRN
  Administered 2024-07-17 (×3): 1 [drp] via OPHTHALMIC

## 2024-07-17 MED ORDER — STERILE WATER FOR IRRIGATION IR SOLN
Status: DC | PRN
  Administered 2024-07-17: 250 mL

## 2024-07-17 MED ORDER — SODIUM HYALURONATE 23MG/ML IO SOSY
PREFILLED_SYRINGE | INTRAOCULAR | Status: DC | PRN
  Administered 2024-07-17: .6 mL via INTRAOCULAR

## 2024-07-17 MED ORDER — DEXAMETHASONE 0.4 MG OP INST
VAGINAL_INSERT | OPHTHALMIC | Status: AC
  Filled 2024-07-17: qty 1

## 2024-07-17 MED ORDER — POVIDONE-IODINE 5 % OP SOLN
OPHTHALMIC | Status: DC | PRN
  Administered 2024-07-17: 1 via OPHTHALMIC

## 2024-07-17 MED ORDER — BSS IO SOLN
INTRAOCULAR | Status: DC | PRN
  Administered 2024-07-17: 15 mL via INTRAOCULAR

## 2024-07-17 MED ORDER — PHENYLEPHRINE-KETOROLAC 1-0.3 % IO SOLN
INTRAOCULAR | Status: DC | PRN
  Administered 2024-07-17: 500 mL via OPHTHALMIC

## 2024-07-17 MED ORDER — SODIUM HYALURONATE 10 MG/ML IO SOLUTION
PREFILLED_SYRINGE | INTRAOCULAR | Status: DC | PRN
  Administered 2024-07-17: .85 mL via INTRAOCULAR

## 2024-07-17 MED ORDER — MOXIFLOXACIN HCL 5 MG/ML IO SOLN
INTRAOCULAR | Status: DC | PRN
  Administered 2024-07-17: .2 mL via OPHTHALMIC

## 2024-07-17 SURGICAL SUPPLY — 11 items
CLOTH BEACON ORANGE TIMEOUT ST (SAFETY) ×1 IMPLANT
EYE SHIELD UNIVERSAL CLEAR (GAUZE/BANDAGES/DRESSINGS) IMPLANT
FEE CATARACT SUITE SIGHTPATH (MISCELLANEOUS) ×1 IMPLANT
GLOVE BIOGEL PI IND STRL 7.0 (GLOVE) ×2 IMPLANT
LENS IOL TECNIS EYHANCE 20.5 (Intraocular Lens) IMPLANT
NDL HYPO 18GX1.5 BLUNT FILL (NEEDLE) ×1 IMPLANT
NEEDLE HYPO 18GX1.5 BLUNT FILL (NEEDLE) ×1 IMPLANT
PAD ARMBOARD POSITIONER FOAM (MISCELLANEOUS) ×1 IMPLANT
SYR TB 1ML LL NO SAFETY (SYRINGE) ×1 IMPLANT
TAPE SURG TRANSPORE 1 IN (GAUZE/BANDAGES/DRESSINGS) IMPLANT
WATER STERILE IRR 250ML POUR (IV SOLUTION) ×1 IMPLANT

## 2024-07-17 NOTE — Anesthesia Postprocedure Evaluation (Signed)
 Anesthesia Post Note  Patient: [name]  Procedure(s) Performed: PHACOEMULSIFICATION, CATARACT, WITH IOL INSERTION (Left: Eye) INSERTION, STENT, DRUG-ELUTING, LACRIMAL CANALICULUS (Left: Eye)  Patient location during evaluation: Short Stay Anesthesia Type: MAC Level of consciousness: awake and alert Pain management: pain level controlled Vital Signs Assessment: post-procedure vital signs reviewed and stable Respiratory status: spontaneous breathing Cardiovascular status: blood pressure returned to baseline and stable Postop Assessment: no apparent nausea or vomiting Anesthetic complications: no   No notable events documented.   Last Vitals:  Vitals:   07/17/24 1028 07/17/24 1126  BP: (!) 151/76 (!) 154/80  Pulse: (!) 58 (!) 55  Resp: 11 12  Temp: 36.6 C 36.4 C  SpO2: 100% 100%    Last Pain:  Vitals:   07/17/24 1126  TempSrc: Oral  PainSc: 0-No pain                 Mariadelaluz G[place]

## 2024-07-17 NOTE — Anesthesia Preprocedure Evaluation (Signed)
 Anesthesia Evaluation  Patient identified by MRN, date of birth, ID band Patient awake    Reviewed: Allergy & Precautions, H&P , NPO status , Patient's Chart, lab work & pertinent test results, reviewed documented beta blocker date and time   Airway Mallampati: II  TM Distance: >3 FB Neck ROM: full    Dental no notable dental hx.    Pulmonary neg pulmonary ROS, Current Smoker and Patient abstained from smoking.   Pulmonary exam normal breath sounds clear to auscultation       Cardiovascular Exercise Tolerance: Good hypertension, + CAD and + Past MI  + dysrhythmias  Rhythm:regular Rate:Normal     Neuro/Psych negative neurological ROS  negative psych ROS   GI/Hepatic Neg liver ROS,GERD  ,,  Endo/Other  negative endocrine ROS    Renal/GU negative Renal ROS  negative genitourinary   Musculoskeletal   Abdominal   Peds  Hematology negative hematology ROS (+)   Anesthesia Other Findings   Reproductive/Obstetrics negative OB ROS                              Anesthesia Physical Anesthesia Plan  ASA: 3  Anesthesia Plan: MAC   Post-op Pain Management:    Induction:   PONV Risk Score and Plan:   Airway Management Planned:   Additional Equipment:   Intra-op Plan:   Post-operative Plan:   Informed Consent: I have reviewed the patients History and Physical, chart, labs and discussed the procedure including the risks, benefits and alternatives for the proposed anesthesia with the patient or authorized representative who has indicated his/her understanding and acceptance.     Dental Advisory Given  Plan Discussed with: CRNA  Anesthesia Plan Comments:         Anesthesia Quick Evaluation

## 2024-07-17 NOTE — Op Note (Signed)
 Date of procedure: 07/17/24  Pre-operative diagnosis: Visually significant age-related combined cataract, Left Eye (H25.812)  Post-operative diagnosis:  Visually significant age-related combined cataract, Left Eye (H25.812) 2.   Pain and inflammation following cataract surgery, Left Eye (H57.12)  Procedure:  Removal of cataract via phacoemulsification and insertion of intra-ocular lens [employer] DIB00 +20.5D into the capsular bag of the Left Eye 2. Placement of Dextenza  Implant, Left Lower Lid  Attending surgeon: Lynwood LABOR. Langston Tuberville, MD, MA  Anesthesia: MAC, Topical Akten   Complications: None  Estimated Blood Loss: <58mL (minimal)  Specimens: None  Implants: As above  Indications:  Visually significant age-related cataract, Left Eye  Procedure:  The patient was seen and identified in the pre-operative area. The operative eye was identified and dilated.  The operative eye was marked.  Topical anesthesia was administered to the operative eye.     The patient was then to the operative suite and placed in the supine position.  A timeout was performed confirming the patient, procedure to be performed, and all other relevant information.   The patient's face was prepped and draped in the usual fashion for intra-ocular surgery.  A lid speculum was placed into the operative eye and the surgical microscope moved into place and focused.  An inferotemporal paracentesis was created using a 20 gauge paracentesis blade. Omidria  was injected into the anterior chamber. Shugarcaine was injected into the anterior chamber.  Viscoelastic was injected into the anterior chamber.  A temporal clear-corneal main wound incision was created using a 2.11mm microkeratome.  A continuous curvilinear capsulorrhexis was initiated using an irrigating cystitome and completed using capsulorrhexis forceps.  Hydrodissection and hydrodeliniation were performed.  Viscoelastic was injected into the anterior chamber.  A  phacoemulsification handpiece and a chopper as a second instrument were used to remove the nucleus and epinucleus. The irrigation/aspiration handpiece was used to remove any remaining cortical material.   The capsular bag was reinflated with viscoelastic, checked, and found to be intact.  The intraocular lens was inserted into the capsular bag.  The irrigation/aspiration handpiece was used to remove any remaining viscoelastic.  The clear corneal wound and paracentesis wounds were then hydrated and checked with Weck-Cels to be watertight.  0.1mL of moxifloxacin  was injected into the anterior chamber.  The lid-speculum was removed. The lower punctum was dilated. A Dextenza  implant was placed in the lower canaliculus without complication.   The drape was removed.  The patient's face was cleaned with a wet and dry 4x4.   A clear shield was taped over the eye. The patient was taken to the post-operative care unit in good condition, having tolerated the procedure well.  Post-Op Instructions: The patient will follow up at [facility] for a same day post-operative evaluation and will receive all other orders and instructions.

## 2024-07-17 NOTE — Transfer of Care (Signed)
 Immediate Anesthesia Transfer of Care Note  Patient: Preston Ortiz  Procedure(s) Performed: PHACOEMULSIFICATION, CATARACT, WITH IOL INSERTION (Left: Eye) INSERTION, STENT, DRUG-ELUTING, LACRIMAL CANALICULUS (Left: Eye)  Patient Location: Short Stay  Anesthesia Type:MAC  Level of Consciousness: awake  Airway & Oxygen Therapy: Patient Spontanous Breathing  Post-op Assessment: Report given to RN  Post vital signs: Reviewed and stable  Last Vitals:  Vitals Value Taken Time  BP    Temp    Pulse    Resp    SpO2      Last Pain:  Vitals:   07/17/24 1028  TempSrc: Oral  PainSc: 0-No pain         Complications: No notable events documented.

## 2024-07-17 NOTE — Interval H&P Note (Signed)
 dHistory and Physical Interval Note:  07/17/2024 11:01 AM  Preston Ortiz  has presented today for surgery, with the diagnosis of combined forms age related cataract, left eye.  The various methods of treatment have been discussed with the patient and family. After consideration of risks, benefits and other options for treatment, the patient has consented to  Procedure(s) with comments: PHACOEMULSIFICATION, CATARACT, WITH IOL INSERTION (Left) - CDE: INSERTION, STENT, DRUG-ELUTING, LACRIMAL CANALICULUS (Left) as a surgical intervention.  The patient's history has been reviewed, patient examined, no change in status, stable for surgery.  I have reviewed the patient's chart and labs.  Questions were answered to the patient's satisfaction.     HARRIE AGENT

## 2024-07-17 NOTE — Discharge Instructions (Signed)
 Please discharge patient when stable, will follow up today with Dr. June Leap at the [facility] office immediately following discharge.  Leave shield in place until visit.  All paperwork with discharge instructions will be given at the office.  [facility] Address:  [address]  [place], [place] [address]

## 2024-07-18 ENCOUNTER — Encounter (HOSPITAL_COMMUNITY): Payer: Self-pay | Admitting: Ophthalmology

## 2024-08-09 ENCOUNTER — Encounter (INDEPENDENT_AMBULATORY_CARE_PROVIDER_SITE_OTHER): Payer: Self-pay | Admitting: Gastroenterology

## 2024-09-10 ENCOUNTER — Other Ambulatory Visit: Payer: Self-pay | Admitting: Internal Medicine

## 2024-11-02 ENCOUNTER — Encounter: Payer: Self-pay | Admitting: Cardiology

## 2024-12-08 ENCOUNTER — Ambulatory Visit: Admitting: Student in an Organized Health Care Education/Training Program
